# Patient Record
Sex: Female | Born: 1935 | Race: White | Hispanic: No | Marital: Married | Smoking: Former smoker
Health system: Southern US, Community
[De-identification: ages and names within clinical notes are randomized; demographics above are authoritative.]

## PROBLEM LIST (undated history)

## (undated) DIAGNOSIS — H269 Unspecified cataract: Secondary | ICD-10-CM

## (undated) DIAGNOSIS — M810 Age-related osteoporosis without current pathological fracture: Secondary | ICD-10-CM

## (undated) DIAGNOSIS — C801 Malignant (primary) neoplasm, unspecified: Secondary | ICD-10-CM

## (undated) DIAGNOSIS — IMO0002 Reserved for concepts with insufficient information to code with codable children: Secondary | ICD-10-CM

## (undated) DIAGNOSIS — K219 Gastro-esophageal reflux disease without esophagitis: Secondary | ICD-10-CM

## (undated) DIAGNOSIS — F5231 Female orgasmic disorder: Secondary | ICD-10-CM

## (undated) HISTORY — DX: Gastro-esophageal reflux disease without esophagitis: K21.9

## (undated) HISTORY — DX: Reserved for concepts with insufficient information to code with codable children: IMO0002

## (undated) HISTORY — DX: Malignant (primary) neoplasm, unspecified: C80.1

## (undated) HISTORY — DX: Female orgasmic disorder: F52.31

## (undated) HISTORY — DX: Age-related osteoporosis without current pathological fracture: M81.0

## (undated) HISTORY — PX: SYNOVIAL CYST EXCISION: SUR507

## (undated) HISTORY — PX: CATARACT EXTRACTION: SUR2

## (undated) HISTORY — DX: Unspecified cataract: H26.9

---

## 1996-11-19 DIAGNOSIS — C801 Malignant (primary) neoplasm, unspecified: Secondary | ICD-10-CM

## 1996-11-19 HISTORY — DX: Malignant (primary) neoplasm, unspecified: C80.1

## 1997-01-17 HISTORY — PX: BREAST SURGERY: SHX581

## 1998-11-19 DIAGNOSIS — K219 Gastro-esophageal reflux disease without esophagitis: Secondary | ICD-10-CM

## 1998-11-19 HISTORY — DX: Gastro-esophageal reflux disease without esophagitis: K21.9

## 1999-03-01 ENCOUNTER — Other Ambulatory Visit: Admission: RE | Admit: 1999-03-01 | Discharge: 1999-03-01 | Payer: Self-pay | Admitting: Gynecology

## 1999-12-22 ENCOUNTER — Other Ambulatory Visit: Admission: RE | Admit: 1999-12-22 | Discharge: 1999-12-22 | Payer: Self-pay | Admitting: Obstetrics and Gynecology

## 2000-12-20 ENCOUNTER — Other Ambulatory Visit: Admission: RE | Admit: 2000-12-20 | Discharge: 2000-12-20 | Payer: Self-pay | Admitting: Obstetrics and Gynecology

## 2001-06-03 ENCOUNTER — Ambulatory Visit (HOSPITAL_COMMUNITY): Admission: RE | Admit: 2001-06-03 | Discharge: 2001-06-03 | Payer: Self-pay | Admitting: Gastroenterology

## 2002-01-12 ENCOUNTER — Other Ambulatory Visit: Admission: RE | Admit: 2002-01-12 | Discharge: 2002-01-12 | Payer: Self-pay | Admitting: Obstetrics and Gynecology

## 2002-08-19 ENCOUNTER — Ambulatory Visit (HOSPITAL_COMMUNITY): Admission: RE | Admit: 2002-08-19 | Discharge: 2002-08-19 | Payer: Self-pay | Admitting: Oncology

## 2002-08-19 ENCOUNTER — Encounter: Payer: Self-pay | Admitting: Oncology

## 2002-11-24 ENCOUNTER — Ambulatory Visit (HOSPITAL_BASED_OUTPATIENT_CLINIC_OR_DEPARTMENT_OTHER): Admission: RE | Admit: 2002-11-24 | Discharge: 2002-11-24 | Payer: Self-pay | Admitting: Orthopedic Surgery

## 2003-01-18 ENCOUNTER — Other Ambulatory Visit: Admission: RE | Admit: 2003-01-18 | Discharge: 2003-01-18 | Payer: Self-pay | Admitting: Obstetrics and Gynecology

## 2003-06-14 ENCOUNTER — Encounter: Admission: RE | Admit: 2003-06-14 | Discharge: 2003-06-14 | Payer: Self-pay | Admitting: *Deleted

## 2003-06-14 ENCOUNTER — Encounter: Payer: Self-pay | Admitting: *Deleted

## 2004-01-25 ENCOUNTER — Other Ambulatory Visit: Admission: RE | Admit: 2004-01-25 | Discharge: 2004-01-25 | Payer: Self-pay | Admitting: Obstetrics and Gynecology

## 2004-04-13 ENCOUNTER — Encounter: Admission: RE | Admit: 2004-04-13 | Discharge: 2004-04-13 | Payer: Self-pay | Admitting: Orthopedic Surgery

## 2005-02-06 ENCOUNTER — Ambulatory Visit: Payer: Self-pay | Admitting: Oncology

## 2005-02-14 ENCOUNTER — Other Ambulatory Visit: Admission: RE | Admit: 2005-02-14 | Discharge: 2005-02-14 | Payer: Self-pay | Admitting: Obstetrics and Gynecology

## 2005-05-23 ENCOUNTER — Encounter: Admission: RE | Admit: 2005-05-23 | Discharge: 2005-05-23 | Payer: Self-pay | Admitting: Orthopedic Surgery

## 2005-09-07 ENCOUNTER — Ambulatory Visit: Payer: Self-pay | Admitting: Oncology

## 2006-03-01 ENCOUNTER — Ambulatory Visit: Payer: Self-pay | Admitting: Oncology

## 2006-03-04 LAB — COMPREHENSIVE METABOLIC PANEL
Albumin: 4.4 g/dL (ref 3.5–5.2)
Alkaline Phosphatase: 38 U/L — ABNORMAL LOW (ref 39–117)
CO2: 25 mEq/L (ref 19–32)
Chloride: 104 mEq/L (ref 96–112)
Creatinine, Ser: 0.8 mg/dL (ref 0.4–1.2)
Potassium: 4.8 mEq/L (ref 3.5–5.3)
Sodium: 139 mEq/L (ref 135–145)

## 2006-03-04 LAB — CBC WITH DIFFERENTIAL/PLATELET
EOS%: 1 % (ref 0.0–7.0)
Eosinophils Absolute: 0 10*3/uL (ref 0.0–0.5)
HCT: 42.9 % (ref 34.8–46.6)
LYMPH%: 33.3 % (ref 14.0–48.0)
MCHC: 33.8 g/dL (ref 32.0–36.0)
MCV: 93.1 fL (ref 81.0–101.0)
MONO#: 0.3 10*3/uL (ref 0.1–0.9)
MONO%: 6 % (ref 0.0–13.0)
RBC: 4.61 10*6/uL (ref 3.70–5.32)
lymph#: 1.6 10*3/uL (ref 0.9–3.3)

## 2006-03-07 ENCOUNTER — Encounter: Admission: RE | Admit: 2006-03-07 | Discharge: 2006-03-07 | Payer: Self-pay | Admitting: Orthopedic Surgery

## 2006-04-19 ENCOUNTER — Other Ambulatory Visit: Admission: RE | Admit: 2006-04-19 | Discharge: 2006-04-19 | Payer: Self-pay | Admitting: Obstetrics and Gynecology

## 2006-08-28 ENCOUNTER — Ambulatory Visit: Payer: Self-pay | Admitting: Oncology

## 2006-08-30 LAB — CBC WITH DIFFERENTIAL/PLATELET
EOS%: 1.2 % (ref 0.0–7.0)
Eosinophils Absolute: 0.1 10*3/uL (ref 0.0–0.5)
HGB: 14.7 g/dL (ref 11.6–15.9)
LYMPH%: 34 % (ref 14.0–48.0)
MCV: 93.2 fL (ref 81.0–101.0)
MONO#: 0.3 10*3/uL (ref 0.1–0.9)
MONO%: 7.7 % (ref 0.0–13.0)
Platelets: 261 10*3/uL (ref 145–400)
RBC: 4.61 10*6/uL (ref 3.70–5.32)
RDW: 13 % (ref 11.3–14.5)
WBC: 4.2 10*3/uL (ref 3.9–10.0)
lymph#: 1.4 10*3/uL (ref 0.9–3.3)

## 2006-08-30 LAB — COMPREHENSIVE METABOLIC PANEL
ALT: 19 U/L (ref 0–40)
AST: 17 U/L (ref 0–37)
Creatinine, Ser: 0.86 mg/dL (ref 0.40–1.20)
Sodium: 139 mEq/L (ref 135–145)
Total Bilirubin: 0.8 mg/dL (ref 0.3–1.2)

## 2006-12-31 ENCOUNTER — Ambulatory Visit: Payer: Self-pay | Admitting: Oncology

## 2007-02-03 ENCOUNTER — Encounter: Admission: RE | Admit: 2007-02-03 | Discharge: 2007-02-03 | Payer: Self-pay | Admitting: Orthopedic Surgery

## 2007-04-07 ENCOUNTER — Encounter: Admission: RE | Admit: 2007-04-07 | Discharge: 2007-04-07 | Payer: Self-pay | Admitting: Orthopedic Surgery

## 2007-04-22 ENCOUNTER — Other Ambulatory Visit: Admission: RE | Admit: 2007-04-22 | Discharge: 2007-04-22 | Payer: Self-pay | Admitting: Obstetrics & Gynecology

## 2007-07-02 ENCOUNTER — Ambulatory Visit: Payer: Self-pay | Admitting: Oncology

## 2007-07-04 LAB — COMPREHENSIVE METABOLIC PANEL
BUN: 16 mg/dL (ref 6–23)
CO2: 24 mEq/L (ref 19–32)
Chloride: 108 mEq/L (ref 96–112)
Glucose, Bld: 61 mg/dL — ABNORMAL LOW (ref 70–99)
Potassium: 4.6 mEq/L (ref 3.5–5.3)
Total Protein: 6.3 g/dL (ref 6.0–8.3)

## 2007-07-04 LAB — CBC WITH DIFFERENTIAL/PLATELET
Basophils Absolute: 0 10*3/uL (ref 0.0–0.1)
Eosinophils Absolute: 0.1 10*3/uL (ref 0.0–0.5)
MCHC: 35.6 g/dL (ref 32.0–36.0)
MCV: 92.2 fL (ref 81.0–101.0)
MONO#: 0.3 10*3/uL (ref 0.1–0.9)
NEUT%: 45.6 % (ref 39.6–76.8)
Platelets: 291 10*3/uL (ref 145–400)
lymph#: 1.6 10*3/uL (ref 0.9–3.3)

## 2008-01-06 ENCOUNTER — Encounter: Admission: RE | Admit: 2008-01-06 | Discharge: 2008-01-06 | Payer: Self-pay | Admitting: Orthopedic Surgery

## 2008-04-21 ENCOUNTER — Other Ambulatory Visit: Admission: RE | Admit: 2008-04-21 | Discharge: 2008-04-21 | Payer: Self-pay | Admitting: Obstetrics and Gynecology

## 2008-06-09 ENCOUNTER — Ambulatory Visit: Payer: Self-pay | Admitting: Oncology

## 2008-11-19 HISTORY — PX: BUNIONECTOMY: SHX129

## 2008-12-20 ENCOUNTER — Encounter: Admission: RE | Admit: 2008-12-20 | Discharge: 2008-12-20 | Payer: Self-pay | Admitting: Orthopedic Surgery

## 2009-07-12 ENCOUNTER — Encounter: Payer: Self-pay | Admitting: *Deleted

## 2009-07-12 DIAGNOSIS — M899 Disorder of bone, unspecified: Secondary | ICD-10-CM | POA: Insufficient documentation

## 2009-07-12 DIAGNOSIS — M545 Low back pain: Secondary | ICD-10-CM

## 2009-07-12 DIAGNOSIS — C50919 Malignant neoplasm of unspecified site of unspecified female breast: Secondary | ICD-10-CM | POA: Insufficient documentation

## 2009-07-12 DIAGNOSIS — M1990 Unspecified osteoarthritis, unspecified site: Secondary | ICD-10-CM | POA: Insufficient documentation

## 2009-07-12 DIAGNOSIS — G5710 Meralgia paresthetica, unspecified lower limb: Secondary | ICD-10-CM

## 2009-07-12 DIAGNOSIS — R32 Unspecified urinary incontinence: Secondary | ICD-10-CM

## 2009-07-12 DIAGNOSIS — M949 Disorder of cartilage, unspecified: Secondary | ICD-10-CM

## 2009-07-12 DIAGNOSIS — Z9189 Other specified personal risk factors, not elsewhere classified: Secondary | ICD-10-CM

## 2009-07-12 DIAGNOSIS — K219 Gastro-esophageal reflux disease without esophagitis: Secondary | ICD-10-CM | POA: Insufficient documentation

## 2009-07-12 DIAGNOSIS — E7800 Pure hypercholesterolemia, unspecified: Secondary | ICD-10-CM

## 2009-07-12 DIAGNOSIS — H3530 Unspecified macular degeneration: Secondary | ICD-10-CM | POA: Insufficient documentation

## 2009-07-13 ENCOUNTER — Ambulatory Visit: Payer: Self-pay | Admitting: Pulmonary Disease

## 2009-07-13 DIAGNOSIS — R05 Cough: Secondary | ICD-10-CM

## 2009-08-01 ENCOUNTER — Ambulatory Visit: Payer: Self-pay | Admitting: Pulmonary Disease

## 2009-08-19 ENCOUNTER — Encounter: Admission: RE | Admit: 2009-08-19 | Discharge: 2009-08-19 | Payer: Self-pay | Admitting: Neurological Surgery

## 2009-08-19 HISTORY — PX: MOHS SURGERY: SUR867

## 2010-12-10 ENCOUNTER — Encounter: Payer: Self-pay | Admitting: Orthopedic Surgery

## 2010-12-13 ENCOUNTER — Ambulatory Visit
Admission: RE | Admit: 2010-12-13 | Discharge: 2010-12-13 | Payer: Self-pay | Source: Home / Self Care | Attending: Pulmonary Disease | Admitting: Pulmonary Disease

## 2010-12-13 DIAGNOSIS — J310 Chronic rhinitis: Secondary | ICD-10-CM | POA: Insufficient documentation

## 2010-12-21 NOTE — Assessment & Plan Note (Signed)
Summary: rov for chronic rhinitis   Copy to:  Theressa Millard Primary Provider/Referring Provider:  Theressa Millard  CC:  Pt is here for a f/u appt.  Last seen Sept 2010.  Pt c/o "glob of mucus in back of throat" first thing in the am and believes this is d/t PND.  Pt denies sob. Marland Kitchen  History of Present Illness: the pt comes in today for distant f/u of chronic cough.  She was last seen in 2010, and felt to have cough due to postnasal drip/rhinitis as well as LPR.  She was treated with PPI, as well as meds for PND with good success.  She comes in today where she has been noting postnasal drip thru the night, and awakens with a globus sensation and cough first thing in am.  She is taking antihistamine at bedtime, and when she takes atrovent nasal spray in the am it helps quite a bit.    Current Medications (verified): 1)  Oxytrol 3.9 Mg/24hr Pttw (Oxybutynin) .Marland Kitchen.. 1 Patch Twice A Week 2)  Ipratropium Bromide 0.06 % Soln (Ipratropium Bromide) .... Use in Nostril Two Times A Day 3)  Aspirin 81 Mg  Tabs (Aspirin) .... Take 1 Tablet By Mouth Once A Day 4)  Vitamin D 1000 Unit Tabs (Cholecalciferol) 5)  Ginger 500 Mg Caps (Ginger (Zingiber Officinalis)) .... As Needed 6)  Multivitamins  Tabs (Multiple Vitamin) .... Take 1 Tablet By Mouth Once A Day 7)  Calcium 600-D 600-400 Mg-Unit Tabs (Calcium Carbonate-Vitamin D) .... Take 1 Tablet By Mouth Two Times A Day 8)  Omeprazole 40 Mg Cpdr (Omeprazole) .... One in Am and Pm 9)  Magnesium .... Take By Mouth As Needed 10)  Fish Oil .... Take By Mouth Daily 11)  Glucosamine-Chondroitin 1500-1200 Mg/4ml Liqd (Glucosamine-Chondroitin) 12)  Chlorpheniramine Maleate 4 Mg Tabs (Chlorpheniramine Maleate) .... Take 2 Tabs By Mouth Daily 13)  Netipot  Allergies (verified): No Known Drug Allergies  Review of Systems       The patient complains of productive cough, nasal congestion/difficulty breathing through nose, and joint stiffness or pain.  The patient denies  shortness of breath with activity, shortness of breath at rest, non-productive cough, coughing up blood, chest pain, irregular heartbeats, acid heartburn, indigestion, loss of appetite, weight change, abdominal pain, difficulty swallowing, sore throat, tooth/dental problems, headaches, sneezing, itching, ear ache, anxiety, depression, hand/feet swelling, rash, change in color of mucus, and fever.    Vital Signs:  Patient profile:   75 year old female Height:      63 inches Weight:      153 pounds BMI:     27.20 O2 Sat:      98 % on Room air Temp:     98.2 degrees F oral Pulse rate:   76 / minute BP sitting:   126 / 84  (left arm) Cuff size:   regular  Vitals Entered By: Arman Filter LPN (December 13, 2010 11:54 AM)  O2 Flow:  Room air CC: Pt is here for a f/u appt.  Last seen Sept 2010.  Pt c/o "glob of mucus in back of throat" first thing in the am and believes this is d/t PND.  Pt denies sob.  Comments Medications reviewed with patient Arman Filter LPN  December 13, 2010 11:54 AM    Physical Exam  General:  ow female in nad  Nose:  no purulence or discharge noted.  no crusting Mouth:  clear  Lungs:  totally clear to auscultation Heart:  rrr Extremities:  no edema or cyanosis  Neurologic:  alert and oriented, moves all 4.   Impression & Recommendations:  Problem # 1:  COUGH (ICD-786.2)  Much improved from in the past, but continues to have some symptoms related to postnasal drip.  She thinks her reflux is controlled currently unless she has dietary indescretion.    Problem # 2:  CHRONIC RHINITIS (ICD-472.0)  the pt continues to have issues with postnasal drip at times.  I suspect she has both allergic and vasomotor rhinitis.  She feels the atrovent nasal spray is very good at stopping her drip.  I have asked her to try taking in evening at bedtime rather than first thing in the am to see if it helps postnasal drip thru the night.  If she continues to have issues, may  benefit from ENT evaluation.  Could consider allergy eval as well, but unsure how much of this is really an allergic mechanism.    Medications Added to Medication List This Visit: 1)  Vitamin D 1000 Unit Tabs (Cholecalciferol) 2)  Glucosamine-chondroitin 1500-1200 Mg/38ml Liqd (Glucosamine-chondroitin) 3)  Chlorpheniramine Maleate 4 Mg Tabs (Chlorpheniramine maleate) .... Take 2 tabs by mouth daily 4)  Netipot   Other Orders: Est. Patient Level III (82956)  Patient Instructions: 1)  try taking atrovent nasal spray at bedtime rather than first thing in am.   2)  if you continue to have issues, it may be helpful to see ENT one time for evaluation of upper airway/nasal anatomy   Immunization History:  Influenza Immunization History:    Influenza:  historical (08/19/2010)

## 2011-04-06 NOTE — Op Note (Signed)
NAMEANISHKA, BUSHARD                              ACCOUNT NO.:  [id number]   MEDICAL RECORD NO.:  [id number]                   PATIENT TYPE:  AMB   LOCATION:  [facility]                                  FACILITY:  [facility]   PHYSICIAN:  Nadara Mustard, M.D.                DATE OF BIRTH:  1936-09-28   DATE OF PROCEDURE:  11/24/2002  DATE OF DISCHARGE:                                 OPERATIVE REPORT   PREOPERATIVE DIAGNOSIS:  Hallux valgus deformity left great toe.   POSTOPERATIVE DIAGNOSIS:  Hallux valgus deformity left great toe.   PROCEDURE:  Left great toe Chevron osteotomy.   SURGEON:  Nadara Mustard, M.D.   ANESTHESIA:  LMA.   ESTIMATED BLOOD LOSS:  Minimal.   ANTIBIOTICS:  1 gram Kefzol.   TOURNIQUET TIME:  Esmarch to the ankle for approximately 20 minutes.   DISPOSITION:  To PACU in stable condition.   INDICATIONS FOR PROCEDURE:  The patient is a 75 year old woman with a  moderate hallux valgus deformity of her left great toe. The patient has had  pain with shoe wear, has failed conservative care, and her metatarsal angle  is 18 degrees, hallux valgus angle 25 degrees, and she presents at this time  for Chevron osteotomy.  The risks and benefits were discussed including  infection, neurovascular injury, persistent pain, need for additional  surgery.  The patient states she understands and wishes to proceed at this  time.   DESCRIPTION OF PROCEDURE:  The patient was brought to OR room #1 and  underwent a general LMA anesthetic. After adequate level of anesthesia was  obtained, the patient's left lower extremity was prepped using Duraprep and  draped in a sterile field.  Collier Flowers was used to cover all exposed skin. A  longitudinal incision was made over the medial border of the left great toe.  This was carried down through the capsular fascia. The plantar aspect of the  capsule was ellipsed out in a football fashion shape to allow for closure of  the capsule.  A ostectomy was  performed off the lateral bump as well as an  ostectomy was performed off the dorsal bony spur from the great toe. A  Chevron cut was then made and the distal head was displaced laterally  approximately 4 mm.  This was stabilized with a 6.2 K-wire from dorsal to  plantar. The remainder of the bony prominence medially was removed with the  oscillating saw. The wound was irrigated with normal saline.  The toe was  held reduced and the capsule was closed using a 2-0 Vicryl and the skin was  closed using interrupted 2-0 nylon with a vertical mattress suture.  The  wound  was covered with Adaptic, orthopedic sponges, sterile Webril, and a loosely  wrapped Coban.  The K-wire was trimmed just above the skin. The patient was  extubated and taken  to the PACU in stable condition.  Planned for touchdown  weightbearing.  Planned to follow up in the office in one week.                                               Nadara Mustard, M.D.    MVD/MEDQ  D:  11/24/2002  T:  11/24/2002  Job:  [id number]

## 2011-12-04 DIAGNOSIS — M47817 Spondylosis without myelopathy or radiculopathy, lumbosacral region: Secondary | ICD-10-CM | POA: Diagnosis not present

## 2011-12-17 DIAGNOSIS — H16109 Unspecified superficial keratitis, unspecified eye: Secondary | ICD-10-CM | POA: Diagnosis not present

## 2012-03-19 DIAGNOSIS — M5137 Other intervertebral disc degeneration, lumbosacral region: Secondary | ICD-10-CM | POA: Diagnosis not present

## 2012-03-21 DIAGNOSIS — M545 Low back pain: Secondary | ICD-10-CM | POA: Diagnosis not present

## 2012-04-02 DIAGNOSIS — M48061 Spinal stenosis, lumbar region without neurogenic claudication: Secondary | ICD-10-CM | POA: Diagnosis not present

## 2012-04-08 DIAGNOSIS — M4120 Other idiopathic scoliosis, site unspecified: Secondary | ICD-10-CM | POA: Diagnosis not present

## 2012-04-08 DIAGNOSIS — M48061 Spinal stenosis, lumbar region without neurogenic claudication: Secondary | ICD-10-CM | POA: Diagnosis not present

## 2012-04-08 DIAGNOSIS — M47817 Spondylosis without myelopathy or radiculopathy, lumbosacral region: Secondary | ICD-10-CM | POA: Diagnosis not present

## 2012-05-12 DIAGNOSIS — Z01419 Encounter for gynecological examination (general) (routine) without abnormal findings: Secondary | ICD-10-CM | POA: Diagnosis not present

## 2012-05-12 DIAGNOSIS — Z124 Encounter for screening for malignant neoplasm of cervix: Secondary | ICD-10-CM | POA: Diagnosis not present

## 2012-05-12 DIAGNOSIS — Z9189 Other specified personal risk factors, not elsewhere classified: Secondary | ICD-10-CM | POA: Diagnosis not present

## 2012-05-13 DIAGNOSIS — R05 Cough: Secondary | ICD-10-CM | POA: Diagnosis not present

## 2012-05-13 DIAGNOSIS — R059 Cough, unspecified: Secondary | ICD-10-CM | POA: Diagnosis not present

## 2012-05-14 DIAGNOSIS — N8111 Cystocele, midline: Secondary | ICD-10-CM | POA: Diagnosis not present

## 2012-05-27 DIAGNOSIS — N8111 Cystocele, midline: Secondary | ICD-10-CM | POA: Diagnosis not present

## 2012-06-03 DIAGNOSIS — N8111 Cystocele, midline: Secondary | ICD-10-CM | POA: Diagnosis not present

## 2012-09-01 DIAGNOSIS — Z85828 Personal history of other malignant neoplasm of skin: Secondary | ICD-10-CM | POA: Diagnosis not present

## 2012-09-01 DIAGNOSIS — L819 Disorder of pigmentation, unspecified: Secondary | ICD-10-CM | POA: Diagnosis not present

## 2012-09-01 DIAGNOSIS — C44519 Basal cell carcinoma of skin of other part of trunk: Secondary | ICD-10-CM | POA: Diagnosis not present

## 2012-09-01 DIAGNOSIS — D692 Other nonthrombocytopenic purpura: Secondary | ICD-10-CM | POA: Diagnosis not present

## 2012-09-01 DIAGNOSIS — D239 Other benign neoplasm of skin, unspecified: Secondary | ICD-10-CM | POA: Diagnosis not present

## 2012-09-01 DIAGNOSIS — L570 Actinic keratosis: Secondary | ICD-10-CM | POA: Diagnosis not present

## 2012-09-01 DIAGNOSIS — L821 Other seborrheic keratosis: Secondary | ICD-10-CM | POA: Diagnosis not present

## 2012-09-01 DIAGNOSIS — L259 Unspecified contact dermatitis, unspecified cause: Secondary | ICD-10-CM | POA: Diagnosis not present

## 2012-09-10 DIAGNOSIS — Z1231 Encounter for screening mammogram for malignant neoplasm of breast: Secondary | ICD-10-CM | POA: Diagnosis not present

## 2012-09-11 DIAGNOSIS — N390 Urinary tract infection, site not specified: Secondary | ICD-10-CM | POA: Diagnosis not present

## 2012-09-11 DIAGNOSIS — Z131 Encounter for screening for diabetes mellitus: Secondary | ICD-10-CM | POA: Diagnosis not present

## 2012-09-11 DIAGNOSIS — Z0000 Encounter for general adult medical examination without abnormal findings: Secondary | ICD-10-CM | POA: Diagnosis not present

## 2012-09-11 DIAGNOSIS — Z23 Encounter for immunization: Secondary | ICD-10-CM | POA: Diagnosis not present

## 2012-09-11 DIAGNOSIS — Z1331 Encounter for screening for depression: Secondary | ICD-10-CM | POA: Diagnosis not present

## 2012-09-15 DIAGNOSIS — C44519 Basal cell carcinoma of skin of other part of trunk: Secondary | ICD-10-CM | POA: Diagnosis not present

## 2012-09-15 DIAGNOSIS — Z85828 Personal history of other malignant neoplasm of skin: Secondary | ICD-10-CM | POA: Diagnosis not present

## 2012-09-30 DIAGNOSIS — H18419 Arcus senilis, unspecified eye: Secondary | ICD-10-CM | POA: Diagnosis not present

## 2012-09-30 DIAGNOSIS — H5210 Myopia, unspecified eye: Secondary | ICD-10-CM | POA: Diagnosis not present

## 2012-09-30 DIAGNOSIS — H43399 Other vitreous opacities, unspecified eye: Secondary | ICD-10-CM | POA: Diagnosis not present

## 2012-09-30 DIAGNOSIS — Z961 Presence of intraocular lens: Secondary | ICD-10-CM | POA: Diagnosis not present

## 2012-12-12 DIAGNOSIS — M47817 Spondylosis without myelopathy or radiculopathy, lumbosacral region: Secondary | ICD-10-CM | POA: Diagnosis not present

## 2012-12-12 DIAGNOSIS — M4180 Other forms of scoliosis, site unspecified: Secondary | ICD-10-CM | POA: Diagnosis not present

## 2012-12-12 DIAGNOSIS — M4120 Other idiopathic scoliosis, site unspecified: Secondary | ICD-10-CM | POA: Diagnosis not present

## 2012-12-12 DIAGNOSIS — IMO0002 Reserved for concepts with insufficient information to code with codable children: Secondary | ICD-10-CM | POA: Diagnosis not present

## 2013-03-23 ENCOUNTER — Telehealth: Payer: Self-pay | Admitting: *Deleted

## 2013-03-23 NOTE — Telephone Encounter (Signed)
Patient chart has a note that states unsure if the patient was to continue taking this medication in 05/12/12 please advise.

## 2013-03-24 ENCOUNTER — Other Ambulatory Visit: Payer: Self-pay | Admitting: *Deleted

## 2013-03-24 MED ORDER — RALOXIFENE HCL 60 MG PO TABS: 60.0000 mg | ORAL_TABLET | Freq: Every day | ORAL | Status: DC

## 2013-03-24 NOTE — Telephone Encounter (Signed)
Patient will need phone call regarding continuation decision

## 2013-03-24 NOTE — Telephone Encounter (Signed)
LMTCB  aa    (Need to ask if she is still taking Evista and if she would like to continue it.)

## 2013-03-24 NOTE — Telephone Encounter (Signed)
Spoke with pt about Evista. Pt is still taking it and is doing well with no side effects she is aware of. Pt would like a refill, and she still uses [facility].

## 2013-03-24 NOTE — Telephone Encounter (Signed)
Sent with 5 Rf

## 2013-03-24 NOTE — Telephone Encounter (Signed)
Ok to refill until exam

## 2013-03-26 ENCOUNTER — Other Ambulatory Visit: Payer: Self-pay | Admitting: Orthopedic Surgery

## 2013-06-30 ENCOUNTER — Ambulatory Visit: Payer: Self-pay | Admitting: Obstetrics and Gynecology

## 2013-07-15 DIAGNOSIS — M899 Disorder of bone, unspecified: Secondary | ICD-10-CM | POA: Diagnosis not present

## 2013-07-17 ENCOUNTER — Encounter: Payer: Self-pay | Admitting: Obstetrics and Gynecology

## 2013-07-17 ENCOUNTER — Ambulatory Visit (INDEPENDENT_AMBULATORY_CARE_PROVIDER_SITE_OTHER): Payer: Medicare Other | Admitting: Obstetrics and Gynecology

## 2013-07-17 VITALS — BP 132/85 | HR 73 | Resp 14 | Ht 62.5 in | Wt 136.0 lb

## 2013-07-17 DIAGNOSIS — Z124 Encounter for screening for malignant neoplasm of cervix: Secondary | ICD-10-CM | POA: Diagnosis not present

## 2013-07-17 DIAGNOSIS — Z01419 Encounter for gynecological examination (general) (routine) without abnormal findings: Secondary | ICD-10-CM

## 2013-07-17 NOTE — Progress Notes (Signed)
77 y.o.   Married    Caucasian   female   G2P2   here for annual exam.  Moving to [place] soon.    Patient's last menstrual period was 11/20/1987.          Sexually active: no  The current method of family planning is abstinence and post menopausal status.    Exercising: Pilates, Yoga, Weights Last mammogram:  Oct 2013 Last pap smear: History of abnormal pap:  Smoking: quit smoking in 1966 Alcohol: 5-6 glasses of wine week Last colonoscopy: 2012 ? Polyps, repeat in 5 years Last Bone Density:  07/16/11 Osteopenia, 07/15/13 Last tetanus shot: less than 10 years Last cholesterol check: 09/2013 normal  Hgb:   pcp             Urine: pcp   Family History  Problem Relation Age of Onset  . Osteoporosis Mother   . Heart disease Father     Patient Active Problem List   Diagnosis Date Noted  . CHRONIC RHINITIS 12/13/2010  . COUGH 07/13/2009  . BREAST CANCER 07/12/2009  . PURE HYPERCHOLESTEROLEMIA 07/12/2009  . MERALGIA PARESTHETICA 07/12/2009  . MACULAR DEGENERATION 07/12/2009  . GERD 07/12/2009  . OSTEOARTHRITIS 07/12/2009  . BACK PAIN, LUMBAR 07/12/2009  . OSTEOPENIA 07/12/2009  . URINARY INCONTINENCE 07/12/2009  . BREAST BIOPSY, BY INCISION, HX OF 07/12/2009    Past Medical History  Diagnosis Date  . Osteoporosis   . GERD (gastroesophageal reflux disease) 2000  . Cancer 1998    Left breast Lumpectomy, Chemo,Rad  . Anorgasmia of female   . DDD (degenerative disc disease)   . Cataract 2010 2011    Past Surgical History  Procedure Laterality Date  . Breast surgery  3/98    lumpectomy (left)  . Cataract extraction Bilateral 2010 2011  . Synovial cyst excision      lumbar  . Mohs surgery  08/2009    nose, squamous cell ca    Allergies: Review of patient's allergies indicates no known allergies.  Current Outpatient Prescriptions  Medication Sig Dispense Refill  . aspirin 81 MG tablet Take 81 mg by mouth daily.      . chlorpheniramine (CHLOR-TRIMETON) 4 MG  tablet Take 4 mg by mouth 2 (two) times daily as needed for allergies.      . Ginger, Zingiber officinalis, (GINGER PO) Take by mouth.      . Oxymetazoline HCl (NASAL SPRAY NA) Place into the nose daily.      . raloxifene (EVISTA) 60 MG tablet Take 1 tablet (60 mg total) by mouth daily.  30 tablet  5  . OMEPRAZOLE PO Take by mouth.       No current facility-administered medications for this visit.    ROS: Pertinent items are noted in HPI.  Social AV:[id number], two children, French Ana, who has 2 daughters, and Lanora Manis who has 2 sons  Exam:    BP 132/85  Pulse 73  Resp 14  Ht 5' 2.5" (1.588 m)  Wt 136 lb (61.689 kg)  BMI 24.46 kg/m2  LMP 01/01/1989Ht stable and wt down 9 pounds from last yr   Wt Readings from Last 3 Encounters:  07/17/13 136 lb (61.689 kg)  12/13/10 153 lb (69.4 kg)  08/01/09 157 lb 2.1 oz (71.274 kg)     Ht Readings from Last 3 Encounters:  07/17/13 5' 2.5" (1.588 m)  12/13/10 5\' 3"  (1.6 m)  08/01/09 5\' 3"  (1.6 m)    General appearance: alert, cooperative and appears stated age  Head: Normocephalic, without obvious abnormality, atraumatic Neck: no adenopathy, supple, symmetrical, trachea midline and thyroid not enlarged, symmetric, no tenderness/mass/nodules Lungs: clear to auscultation bilaterally Breasts: Inspection negative, No nipple retraction or dimpling, No nipple discharge or bleeding, No axillary or supraclavicular adenopathy, Normal to palpation without dominant masses Heart: regular rate and rhythm Abdomen: soft, non-tender; bowel sounds normal; no masses,  no organomegaly Extremities: extremities normal, atraumatic, no cyanosis or edema Skin: Skin color, texture, turgor normal. No rashes or lesions Lymph nodes: Cervical, supraclavicular, and axillary nodes normal. No abnormal inguinal nodes palpated Neurologic: Grossly normal   Pelvic: External genitalia:  no lesions              Urethra:  normal appearing urethra with no masses, tenderness or  lesions              Bartholins and Skenes: normal                 Vagina: normal appearing vagina with normal color and discharge, no lesions.  No vaginal excoriations from pessary.                Cervix: normal appearance              Pap taken: no        Bimanual Exam:  Uterus:  uterus is normal size, shape, consistency and nontender                                      Adnexa: normal adnexa in size, nontender and no masses                                      Rectovaginal: Confirms                                      Anus:  normal sphincter tone, no lesions  A: normal menopausal exam, no HRT     Gr 3 cycstocele, using #4 ring with support     Left breast cancer 1998 - lumpectomy, ax node dissection negative, tamox, RT     Sq cell ca nose s/p Mohs 2010     P:     mammogram counseled on breast self exam, mammography screening, adequate intake of calcium and vitamin D, diet and exercise return annually or prn     An After Visit Summary was printed and given to the patient.

## 2013-07-17 NOTE — Patient Instructions (Signed)

## 2013-08-14 DIAGNOSIS — IMO0002 Reserved for concepts with insufficient information to code with codable children: Secondary | ICD-10-CM | POA: Diagnosis not present

## 2013-08-14 DIAGNOSIS — M47817 Spondylosis without myelopathy or radiculopathy, lumbosacral region: Secondary | ICD-10-CM | POA: Diagnosis not present

## 2013-09-07 DIAGNOSIS — D239 Other benign neoplasm of skin, unspecified: Secondary | ICD-10-CM | POA: Diagnosis not present

## 2013-09-07 DIAGNOSIS — L570 Actinic keratosis: Secondary | ICD-10-CM | POA: Diagnosis not present

## 2013-09-07 DIAGNOSIS — L819 Disorder of pigmentation, unspecified: Secondary | ICD-10-CM | POA: Diagnosis not present

## 2013-09-07 DIAGNOSIS — L719 Rosacea, unspecified: Secondary | ICD-10-CM | POA: Diagnosis not present

## 2013-09-07 DIAGNOSIS — Z85828 Personal history of other malignant neoplasm of skin: Secondary | ICD-10-CM | POA: Diagnosis not present

## 2013-09-07 DIAGNOSIS — L821 Other seborrheic keratosis: Secondary | ICD-10-CM | POA: Diagnosis not present

## 2013-09-07 DIAGNOSIS — Q828 Other specified congenital malformations of skin: Secondary | ICD-10-CM | POA: Diagnosis not present

## 2013-09-07 DIAGNOSIS — D1801 Hemangioma of skin and subcutaneous tissue: Secondary | ICD-10-CM | POA: Diagnosis not present

## 2013-09-07 DIAGNOSIS — D692 Other nonthrombocytopenic purpura: Secondary | ICD-10-CM | POA: Diagnosis not present

## 2013-09-14 DIAGNOSIS — Z1231 Encounter for screening mammogram for malignant neoplasm of breast: Secondary | ICD-10-CM | POA: Diagnosis not present

## 2013-09-17 DIAGNOSIS — E7800 Pure hypercholesterolemia, unspecified: Secondary | ICD-10-CM | POA: Diagnosis not present

## 2013-09-17 DIAGNOSIS — Z853 Personal history of malignant neoplasm of breast: Secondary | ICD-10-CM | POA: Diagnosis not present

## 2013-09-17 DIAGNOSIS — K219 Gastro-esophageal reflux disease without esophagitis: Secondary | ICD-10-CM | POA: Diagnosis not present

## 2013-09-17 DIAGNOSIS — Z0000 Encounter for general adult medical examination without abnormal findings: Secondary | ICD-10-CM | POA: Diagnosis not present

## 2013-09-17 DIAGNOSIS — Z131 Encounter for screening for diabetes mellitus: Secondary | ICD-10-CM | POA: Diagnosis not present

## 2013-09-17 DIAGNOSIS — Z1331 Encounter for screening for depression: Secondary | ICD-10-CM | POA: Diagnosis not present

## 2013-09-29 DIAGNOSIS — H52229 Regular astigmatism, unspecified eye: Secondary | ICD-10-CM | POA: Diagnosis not present

## 2013-09-29 DIAGNOSIS — H5210 Myopia, unspecified eye: Secondary | ICD-10-CM | POA: Diagnosis not present

## 2013-09-29 DIAGNOSIS — Z961 Presence of intraocular lens: Secondary | ICD-10-CM | POA: Diagnosis not present

## 2013-12-14 DIAGNOSIS — M545 Low back pain, unspecified: Secondary | ICD-10-CM | POA: Diagnosis not present

## 2013-12-14 DIAGNOSIS — M5430 Sciatica, unspecified side: Secondary | ICD-10-CM | POA: Diagnosis not present

## 2013-12-17 DIAGNOSIS — M545 Low back pain, unspecified: Secondary | ICD-10-CM | POA: Diagnosis not present

## 2013-12-21 ENCOUNTER — Other Ambulatory Visit: Payer: Self-pay | Admitting: *Deleted

## 2013-12-21 MED ORDER — RALOXIFENE HCL 60 MG PO TABS: 60.0000 mg | ORAL_TABLET | Freq: Every day | ORAL | Status: DC

## 2013-12-21 NOTE — Telephone Encounter (Signed)
Faxed refill request received from [place] for EVISTA Last filled by MD on 03/25/13, #30 X 5 Last AEX - 07/17/13 Last DEXA - 07/15/13, continue Evista Next AEX - not scheduled  RX sent until 06/2014

## 2014-01-15 DIAGNOSIS — M47817 Spondylosis without myelopathy or radiculopathy, lumbosacral region: Secondary | ICD-10-CM | POA: Diagnosis not present

## 2014-01-15 DIAGNOSIS — M4310 Spondylolisthesis, site unspecified: Secondary | ICD-10-CM | POA: Diagnosis not present

## 2014-01-15 DIAGNOSIS — IMO0002 Reserved for concepts with insufficient information to code with codable children: Secondary | ICD-10-CM | POA: Diagnosis not present

## 2014-02-09 ENCOUNTER — Telehealth: Payer: Self-pay

## 2014-02-09 NOTE — Telephone Encounter (Signed)
Pt was notified & she said she thinks her pharmacy had told her also.

## 2014-02-09 NOTE — Telephone Encounter (Signed)
Called pt to let her know that her insurance has approved her evista medication. lmtcb

## 2014-03-16 ENCOUNTER — Telehealth: Payer: Self-pay | Admitting: Gynecology

## 2014-03-16 MED ORDER — RALOXIFENE HCL 60 MG PO TABS: 60.0000 mg | ORAL_TABLET | Freq: Every day | ORAL | Status: DC

## 2014-03-16 NOTE — Telephone Encounter (Signed)
Spoke with patient. She states she lost her bottle of pills or threw it out, she is unsure. [place], attempted to perform a lost rx override and insurance would not cover.  She can pick up next refill at 03/31/14.  Patient will pay cash price for pick up. Requested 20 pills to [facility]. Escribed to [place] at this time. Patient grateful.   Routing to Dr. Charlies Constable.

## 2014-03-16 NOTE — Telephone Encounter (Signed)
Raloxifene generic for Evista. Patient lost her RX for this month and needs and needs to know what to do? She needs 20 pills to get her to the next refill date. She is aware there may be a lost RX charge. Please advise? I we need to charge the patient, please route back to me and I will call her.  [employer]

## 2014-03-19 DIAGNOSIS — R030 Elevated blood-pressure reading, without diagnosis of hypertension: Secondary | ICD-10-CM | POA: Diagnosis not present

## 2014-03-19 DIAGNOSIS — N390 Urinary tract infection, site not specified: Secondary | ICD-10-CM | POA: Diagnosis not present

## 2014-03-19 NOTE — Telephone Encounter (Signed)
Call to patient, she states she just left [facility] and has been given prescription.  Does not need anything from [place]. Call prn.  Routing to provider for final review. Patient agreeable to disposition. Will close encounter

## 2014-03-19 NOTE — Telephone Encounter (Signed)
Patient wants to know if we would send her a rx for Cipro for cystitis. She didn't take any with her out of town. It doesn't look like we have ever given her this before. She thinks it may have been from her pcp i told her she would more than likely need to call that provider to get a rx sent there. Patient said she is going to call them but still wanted me to send a message.

## 2014-04-20 DIAGNOSIS — L819 Disorder of pigmentation, unspecified: Secondary | ICD-10-CM | POA: Diagnosis not present

## 2014-04-20 DIAGNOSIS — Z85828 Personal history of other malignant neoplasm of skin: Secondary | ICD-10-CM | POA: Diagnosis not present

## 2014-04-20 DIAGNOSIS — L719 Rosacea, unspecified: Secondary | ICD-10-CM | POA: Diagnosis not present

## 2014-04-20 DIAGNOSIS — D692 Other nonthrombocytopenic purpura: Secondary | ICD-10-CM | POA: Diagnosis not present

## 2014-04-20 DIAGNOSIS — I789 Disease of capillaries, unspecified: Secondary | ICD-10-CM | POA: Diagnosis not present

## 2014-04-20 DIAGNOSIS — L570 Actinic keratosis: Secondary | ICD-10-CM | POA: Diagnosis not present

## 2014-05-31 DIAGNOSIS — M47817 Spondylosis without myelopathy or radiculopathy, lumbosacral region: Secondary | ICD-10-CM | POA: Diagnosis not present

## 2014-05-31 DIAGNOSIS — M4310 Spondylolisthesis, site unspecified: Secondary | ICD-10-CM | POA: Diagnosis not present

## 2014-05-31 DIAGNOSIS — IMO0002 Reserved for concepts with insufficient information to code with codable children: Secondary | ICD-10-CM | POA: Diagnosis not present

## 2014-08-15 ENCOUNTER — Encounter: Payer: Self-pay | Admitting: *Deleted

## 2014-09-03 DIAGNOSIS — Z23 Encounter for immunization: Secondary | ICD-10-CM | POA: Diagnosis not present

## 2014-09-06 ENCOUNTER — Ambulatory Visit (INDEPENDENT_AMBULATORY_CARE_PROVIDER_SITE_OTHER): Payer: Medicare Other | Admitting: Certified Nurse Midwife

## 2014-09-06 ENCOUNTER — Encounter: Payer: Self-pay | Admitting: Certified Nurse Midwife

## 2014-09-06 VITALS — BP 122/80 | HR 72 | Resp 16 | Ht 62.25 in | Wt 136.0 lb

## 2014-09-06 DIAGNOSIS — Z853 Personal history of malignant neoplasm of breast: Secondary | ICD-10-CM

## 2014-09-06 DIAGNOSIS — Z124 Encounter for screening for malignant neoplasm of cervix: Secondary | ICD-10-CM | POA: Diagnosis not present

## 2014-09-06 DIAGNOSIS — Z01419 Encounter for gynecological examination (general) (routine) without abnormal findings: Secondary | ICD-10-CM | POA: Diagnosis not present

## 2014-09-06 DIAGNOSIS — Z4689 Encounter for fitting and adjustment of other specified devices: Secondary | ICD-10-CM | POA: Diagnosis not present

## 2014-09-06 NOTE — Patient Instructions (Addendum)

## 2014-09-06 NOTE — Progress Notes (Signed)
78 y.o. G2P2 Married Caucasian Fe here for annual exam. Menopausal no HRT. Denies vaginal bleeding or vaginal dryness. Patient working on regular exercise daily. Sees PCP for aex and medication management. Patient continues with Evista and questions regarding continued use. Patient does not use when traveling due the DVT risk and feels this is a good choice for bone support as well as breast cancer risk. BMD not due until early 2016. Living at [place] now and settling in. Pessary working well for cystocele support. Removes daily and reinserts as needed. Denies any vaginal bleeding or dryness. Uses Olive Oil. Incontinence has decreased with use. No other health issues today.   Patient's last menstrual period was 11/20/1987.          Sexually active: No.  The current method of family planning is vasectomy.    Exercising: Yes.    pilates,yoga,weights & cardio Smoker:  no  Health Maintenance: Pap: 05-12-12 neg MMG: 2014 normal per patient Colonoscopy:  2014 BMD:   2014  TDaP:  Up to date per patient Labs: pcp Self breast exam: done occ   reports that she quit smoking about 51 years ago. She has never used smokeless tobacco. She reports that she drinks about 5 ounces of alcohol per week. She reports that she does not use illicit drugs.  Past Medical History  Diagnosis Date  . Osteoporosis   . GERD (gastroesophageal reflux disease) 2000  . Cancer 1998    Left breast Lumpectomy, Chemo,Rad  . Anorgasmia of female   . DDD (degenerative disc disease)   . Cataract 2010 2011    Past Surgical History  Procedure Laterality Date  . Breast surgery  3/98    lumpectomy (left)  . Cataract extraction Bilateral 2010 2011  . Synovial cyst excision      lumbar  . 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 aspirin 81 MG tablet Take 81 mg by mouth daily.      . chlorpheniramine (CHLOR-TRIMETON) 4 MG tablet Take 4 mg by  mouth 2 (two) times daily as needed for allergies.      . Ginger, Zingiber officinalis, (GINGER PO) Take by mouth.      . oxybutynin (OXYTROL) 3.9 MG/24HR Place 1 patch onto the skin every 3 (three) days.      . Oxymetazoline HCl (NASAL SPRAY NA) Place into the nose daily.      . raloxifene (EVISTA) 60 MG tablet Take 1 tablet (60 mg total) by mouth daily.  30 tablet  6   No current facility-administered medications for this visit.    Family History  Problem Relation Age of Onset  . Osteoporosis Mother   . Heart disease Father     ROS:  Pertinent items are noted in HPI.  Otherwise, a comprehensive ROS was negative.  Exam:   BP 122/80  Pulse 72  Resp 16  Ht 5' 2.25" (1.581 m)  Wt 136 lb (61.689 kg)  BMI 24.68 kg/m2  LMP 11/20/1987 Height: 5' 2.25" (158.1 cm)  Ht Readings from Last 3 Encounters:  09/06/14 5' 2.25" (1.581 m)  07/17/13 5' 2.5" (1.588 m)  12/13/10 5\' 3"  (1.6 m)    General appearance: alert, cooperative and appears stated age Head: Normocephalic, without obvious abnormality, atraumatic Neck: no adenopathy, supple, symmetrical, trachea midline and thyroid normal to inspection and palpation Lungs: clear to auscultation bilaterally Breasts: normal appearance, no masses or tenderness,  No nipple retraction or dimpling, No nipple discharge or bleeding, No axillary or supraclavicular adenopathy Heart: regular rate and rhythm Abdomen: soft, non-tender; no masses,  no organomegaly Extremities: extremities normal, atraumatic, no cyanosis or edema Skin: Skin color, texture, turgor normal. No rashes or lesions Lymph nodes: Cervical, supraclavicular, and axillary nodes normal. No abnormal inguinal nodes palpated Neurologic: Grossly normal   Pelvic: External genitalia:  no lesions              Urethra:  normal appearing urethra with no masses, tenderness or lesions              Bartholin's and Skene's: normal                 Vagina: normal appearing vagina with normal  color and discharge, no lesions, pessary present removed. No ulcerations or excoriations noted, grade 3 cystocele noted              Cervix: normal, non tender, no ulcerations or excoriations              Pap taken: Yes.   Bimanual Exam:  Uterus:  normal size, contour, position, consistency, mobility, non-tender and anteverted              Adnexa: normal adnexa and no mass, fullness, tenderness               Rectovaginal: Confirms               Anus:  normal sphincter tone, no lesions  A:  Well Woman with normal exam  Menopausal no HRT  Cystocele grade 3 with  Milex #4 ring pessary use with support, working well  Osteopenia/osteoarthritis on Evista  History of left breast cancer  P:   Reviewed health and wellness pertinent to exam  Aware to advise if vaginal bleeding   Pessary removed and cleaned and reinserted with good fit noted. Patient voided without problems. Patient to call if problems with.  Patient will advise if she desires continuance of Evista for Rx. Discussed risks and benefits and really not able to evaluate until next year BMD. Questions addressed.  Continue aex/labs with PCP.  Stressed yearly mammogram and SBE  Pap smear taken today  counseled on breast self exam, mammography screening, osteoporosis, adequate intake of calcium and vitamin D, diet and exercise, Kegel's exercises  return annually or prn  An After Visit Summary was printed and given to the patient.

## 2014-09-08 LAB — IPS PAP SMEAR ONLY

## 2014-09-10 DIAGNOSIS — Z1231 Encounter for screening mammogram for malignant neoplasm of breast: Secondary | ICD-10-CM | POA: Diagnosis not present

## 2014-09-10 DIAGNOSIS — Z853 Personal history of malignant neoplasm of breast: Secondary | ICD-10-CM | POA: Diagnosis not present

## 2014-09-10 NOTE — Progress Notes (Signed)
Reviewed personally.  M. Suzanne Sommer Spickard, MD.  

## 2014-09-20 ENCOUNTER — Encounter: Payer: Self-pay | Admitting: Certified Nurse Midwife

## 2014-09-22 DIAGNOSIS — Z85828 Personal history of other malignant neoplasm of skin: Secondary | ICD-10-CM | POA: Diagnosis not present

## 2014-09-22 DIAGNOSIS — D2339 Other benign neoplasm of skin of other parts of face: Secondary | ICD-10-CM | POA: Diagnosis not present

## 2014-09-22 DIAGNOSIS — L570 Actinic keratosis: Secondary | ICD-10-CM | POA: Diagnosis not present

## 2014-09-22 DIAGNOSIS — D485 Neoplasm of uncertain behavior of skin: Secondary | ICD-10-CM | POA: Diagnosis not present

## 2014-10-04 DIAGNOSIS — E780 Pure hypercholesterolemia: Secondary | ICD-10-CM | POA: Diagnosis not present

## 2014-10-04 DIAGNOSIS — Z0000 Encounter for general adult medical examination without abnormal findings: Secondary | ICD-10-CM | POA: Diagnosis not present

## 2014-10-04 DIAGNOSIS — Z136 Encounter for screening for cardiovascular disorders: Secondary | ICD-10-CM | POA: Diagnosis not present

## 2014-10-04 DIAGNOSIS — R32 Unspecified urinary incontinence: Secondary | ICD-10-CM | POA: Diagnosis not present

## 2014-10-04 DIAGNOSIS — Z1389 Encounter for screening for other disorder: Secondary | ICD-10-CM | POA: Diagnosis not present

## 2014-10-04 DIAGNOSIS — Z23 Encounter for immunization: Secondary | ICD-10-CM | POA: Diagnosis not present

## 2014-10-04 DIAGNOSIS — M545 Low back pain: Secondary | ICD-10-CM | POA: Diagnosis not present

## 2014-10-04 DIAGNOSIS — K210 Gastro-esophageal reflux disease with esophagitis: Secondary | ICD-10-CM | POA: Diagnosis not present

## 2014-10-05 ENCOUNTER — Other Ambulatory Visit: Payer: Self-pay | Admitting: Certified Nurse Midwife

## 2014-10-05 NOTE — Telephone Encounter (Signed)
Will need to look at BMD also

## 2014-10-05 NOTE — Telephone Encounter (Signed)
Last refilled: 12/31/13 By Ms. Debbie Last AEX: 09/06/14 with Ms. Debbie AEX Scheduled: no AEX scheduled for 2016  Please Advise.

## 2014-10-06 NOTE — Telephone Encounter (Signed)
BMD is in epic from 07/15/13

## 2014-10-06 NOTE — Telephone Encounter (Signed)
S/w patient she said she plans on being on the evista until her next BMD next year, please advise.

## 2014-10-06 NOTE — Telephone Encounter (Signed)
Patient is aware 

## 2014-10-06 NOTE — Telephone Encounter (Signed)
Will refill until that time. Rx signed

## 2014-10-06 NOTE — Telephone Encounter (Signed)
Per my note she was contemplating stopping and could not advise until bone density done in 2016. She needs a call if this is just a pharmacy request.

## 2014-10-19 DIAGNOSIS — H59091 Other disorders of the right eye following cataract surgery: Secondary | ICD-10-CM | POA: Diagnosis not present

## 2014-10-19 DIAGNOSIS — Z961 Presence of intraocular lens: Secondary | ICD-10-CM | POA: Diagnosis not present

## 2014-10-19 DIAGNOSIS — L718 Other rosacea: Secondary | ICD-10-CM | POA: Diagnosis not present

## 2014-10-19 DIAGNOSIS — D2261 Melanocytic nevi of right upper limb, including shoulder: Secondary | ICD-10-CM | POA: Diagnosis not present

## 2014-10-19 DIAGNOSIS — Z85828 Personal history of other malignant neoplasm of skin: Secondary | ICD-10-CM | POA: Diagnosis not present

## 2014-10-19 DIAGNOSIS — H3531 Nonexudative age-related macular degeneration: Secondary | ICD-10-CM | POA: Diagnosis not present

## 2014-10-19 DIAGNOSIS — L570 Actinic keratosis: Secondary | ICD-10-CM | POA: Diagnosis not present

## 2014-10-19 DIAGNOSIS — L821 Other seborrheic keratosis: Secondary | ICD-10-CM | POA: Diagnosis not present

## 2014-11-26 DIAGNOSIS — L570 Actinic keratosis: Secondary | ICD-10-CM | POA: Diagnosis not present

## 2014-12-20 DIAGNOSIS — H02822 Cysts of right lower eyelid: Secondary | ICD-10-CM | POA: Diagnosis not present

## 2014-12-20 DIAGNOSIS — H0012 Chalazion right lower eyelid: Secondary | ICD-10-CM | POA: Diagnosis not present

## 2015-01-06 ENCOUNTER — Other Ambulatory Visit: Payer: Self-pay | Admitting: Certified Nurse Midwife

## 2015-01-07 NOTE — Telephone Encounter (Signed)
Medication refill request: Evista 60 mg Last AEX:  09/06/14 DL Next AEX: Not scheduled  Last MMG (if hormonal medication request): 12/11/13 BIRADS2:Benign  Refill authorized: 10/06/14 #90/0R. Today #90/2R?   Regina Eck, CNM at 10/06/2014 12:05 PM     Status: Signed       Expand All Collapse All   Will refill until that time. Rx signed            Olga Millers, CMA at 10/06/2014 9:23 AM     Status: Signed       Expand All Collapse All   S/w patient she said she plans on being on the evista until her next BMD next year, please advise.       Last BMD 07/15/13.

## 2015-01-07 NOTE — Telephone Encounter (Signed)
Will leave this for DL to decide.

## 2015-01-10 NOTE — Telephone Encounter (Signed)
BMD report states she needs one in 2 years 06/2013  Next Due 07/16/15.  Patient stated she wanted to continue Rx until BMD due Unless Mrs Jackelyn Poling thinks patient is better off of Rx?

## 2015-01-10 NOTE — Telephone Encounter (Signed)
See previous note

## 2015-01-10 NOTE — Telephone Encounter (Signed)
Forwarded this to DL.

## 2015-01-10 NOTE — Telephone Encounter (Signed)
Patient was to have BMD so she could decide if she desired continuance. Needs phone call regarding this

## 2015-01-10 NOTE — Telephone Encounter (Signed)
Patient notified

## 2015-01-10 NOTE — Telephone Encounter (Signed)
Let's continue until BMD. Refills sent

## 2015-01-17 DIAGNOSIS — L570 Actinic keratosis: Secondary | ICD-10-CM | POA: Diagnosis not present

## 2015-02-07 DIAGNOSIS — M5416 Radiculopathy, lumbar region: Secondary | ICD-10-CM | POA: Diagnosis not present

## 2015-02-07 DIAGNOSIS — M47816 Spondylosis without myelopathy or radiculopathy, lumbar region: Secondary | ICD-10-CM | POA: Diagnosis not present

## 2015-02-07 DIAGNOSIS — M4726 Other spondylosis with radiculopathy, lumbar region: Secondary | ICD-10-CM | POA: Diagnosis not present

## 2015-02-07 DIAGNOSIS — M4316 Spondylolisthesis, lumbar region: Secondary | ICD-10-CM | POA: Diagnosis not present

## 2015-02-18 DIAGNOSIS — M47816 Spondylosis without myelopathy or radiculopathy, lumbar region: Secondary | ICD-10-CM | POA: Diagnosis not present

## 2015-02-18 DIAGNOSIS — M4806 Spinal stenosis, lumbar region: Secondary | ICD-10-CM | POA: Diagnosis not present

## 2015-02-21 DIAGNOSIS — R030 Elevated blood-pressure reading, without diagnosis of hypertension: Secondary | ICD-10-CM | POA: Diagnosis not present

## 2015-02-21 DIAGNOSIS — M4316 Spondylolisthesis, lumbar region: Secondary | ICD-10-CM | POA: Diagnosis not present

## 2015-03-24 DIAGNOSIS — M545 Low back pain: Secondary | ICD-10-CM | POA: Diagnosis not present

## 2015-04-04 DIAGNOSIS — L720 Epidermal cyst: Secondary | ICD-10-CM | POA: Diagnosis not present

## 2015-04-04 DIAGNOSIS — D692 Other nonthrombocytopenic purpura: Secondary | ICD-10-CM | POA: Diagnosis not present

## 2015-04-04 DIAGNOSIS — L570 Actinic keratosis: Secondary | ICD-10-CM | POA: Diagnosis not present

## 2015-04-04 DIAGNOSIS — Z85828 Personal history of other malignant neoplasm of skin: Secondary | ICD-10-CM | POA: Diagnosis not present

## 2015-04-12 DIAGNOSIS — M545 Low back pain: Secondary | ICD-10-CM | POA: Diagnosis not present

## 2015-07-14 ENCOUNTER — Telehealth: Payer: Self-pay | Admitting: Certified Nurse Midwife

## 2015-07-14 NOTE — Telephone Encounter (Signed)
Message not needed. °

## 2015-07-19 DIAGNOSIS — R634 Abnormal weight loss: Secondary | ICD-10-CM | POA: Diagnosis not present

## 2015-07-19 DIAGNOSIS — I499 Cardiac arrhythmia, unspecified: Secondary | ICD-10-CM | POA: Diagnosis not present

## 2015-07-19 DIAGNOSIS — R41840 Attention and concentration deficit: Secondary | ICD-10-CM | POA: Diagnosis not present

## 2015-07-20 ENCOUNTER — Ambulatory Visit (INDEPENDENT_AMBULATORY_CARE_PROVIDER_SITE_OTHER): Payer: Medicare Other

## 2015-07-20 DIAGNOSIS — I499 Cardiac arrhythmia, unspecified: Secondary | ICD-10-CM

## 2015-08-16 DIAGNOSIS — R413 Other amnesia: Secondary | ICD-10-CM | POA: Diagnosis not present

## 2015-08-16 DIAGNOSIS — I491 Atrial premature depolarization: Secondary | ICD-10-CM | POA: Diagnosis not present

## 2015-08-16 DIAGNOSIS — R41840 Attention and concentration deficit: Secondary | ICD-10-CM | POA: Diagnosis not present

## 2015-09-13 ENCOUNTER — Telehealth: Payer: Self-pay | Admitting: Certified Nurse Midwife

## 2015-09-13 DIAGNOSIS — Z853 Personal history of malignant neoplasm of breast: Secondary | ICD-10-CM | POA: Diagnosis not present

## 2015-09-13 DIAGNOSIS — M8589 Other specified disorders of bone density and structure, multiple sites: Secondary | ICD-10-CM | POA: Diagnosis not present

## 2015-09-13 DIAGNOSIS — Z1231 Encounter for screening mammogram for malignant neoplasm of breast: Secondary | ICD-10-CM | POA: Diagnosis not present

## 2015-09-13 NOTE — Telephone Encounter (Signed)
Left message on voicemail to call and reschedule cancelled appointment. °

## 2015-09-16 ENCOUNTER — Ambulatory Visit (INDEPENDENT_AMBULATORY_CARE_PROVIDER_SITE_OTHER): Payer: Medicare Other

## 2015-09-16 DIAGNOSIS — Z23 Encounter for immunization: Secondary | ICD-10-CM | POA: Diagnosis not present

## 2015-09-23 ENCOUNTER — Telehealth: Payer: Self-pay

## 2015-09-23 NOTE — Telephone Encounter (Signed)
Called pt to give her BMD results. Osteopenia in left arm. No significant change in hip neck. Spine a slight change. Continue vit d, exercise (weight bearing), yoga & calcium. Continue Evista if no health change. Pt notified. See scanned in results.

## 2015-09-28 ENCOUNTER — Ambulatory Visit: Payer: Medicare Other | Admitting: Certified Nurse Midwife

## 2015-10-06 DIAGNOSIS — Z961 Presence of intraocular lens: Secondary | ICD-10-CM | POA: Diagnosis not present

## 2015-10-06 DIAGNOSIS — H04123 Dry eye syndrome of bilateral lacrimal glands: Secondary | ICD-10-CM | POA: Diagnosis not present

## 2015-10-06 DIAGNOSIS — H5213 Myopia, bilateral: Secondary | ICD-10-CM | POA: Diagnosis not present

## 2015-10-06 DIAGNOSIS — H524 Presbyopia: Secondary | ICD-10-CM | POA: Diagnosis not present

## 2015-10-11 ENCOUNTER — Ambulatory Visit: Payer: Medicare Other | Admitting: Certified Nurse Midwife

## 2015-10-24 ENCOUNTER — Other Ambulatory Visit: Payer: Self-pay | Admitting: Certified Nurse Midwife

## 2015-10-24 NOTE — Telephone Encounter (Signed)
Medication refill request: evista  Last AEX:  09/06/14 DL Next AEX: 11/03/15 DL Last MMG (if hormonal medication request): 09/11/15 BIRADS2: Benign  Refill authorized: 01/10/15 #90tabs/2R. Today please advise.  Routed to [facility]

## 2015-10-28 DIAGNOSIS — D692 Other nonthrombocytopenic purpura: Secondary | ICD-10-CM | POA: Diagnosis not present

## 2015-10-28 DIAGNOSIS — L718 Other rosacea: Secondary | ICD-10-CM | POA: Diagnosis not present

## 2015-10-28 DIAGNOSIS — D2239 Melanocytic nevi of other parts of face: Secondary | ICD-10-CM | POA: Diagnosis not present

## 2015-10-28 DIAGNOSIS — L821 Other seborrheic keratosis: Secondary | ICD-10-CM | POA: Diagnosis not present

## 2015-10-28 DIAGNOSIS — L570 Actinic keratosis: Secondary | ICD-10-CM | POA: Diagnosis not present

## 2015-10-28 DIAGNOSIS — L814 Other melanin hyperpigmentation: Secondary | ICD-10-CM | POA: Diagnosis not present

## 2015-10-28 DIAGNOSIS — Z85828 Personal history of other malignant neoplasm of skin: Secondary | ICD-10-CM | POA: Diagnosis not present

## 2015-11-01 DIAGNOSIS — M5416 Radiculopathy, lumbar region: Secondary | ICD-10-CM | POA: Diagnosis not present

## 2015-11-01 DIAGNOSIS — M4316 Spondylolisthesis, lumbar region: Secondary | ICD-10-CM | POA: Diagnosis not present

## 2015-11-03 ENCOUNTER — Encounter: Payer: Self-pay | Admitting: Certified Nurse Midwife

## 2015-11-03 ENCOUNTER — Ambulatory Visit (INDEPENDENT_AMBULATORY_CARE_PROVIDER_SITE_OTHER): Payer: Medicare Other | Admitting: Certified Nurse Midwife

## 2015-11-03 VITALS — BP 120/64 | HR 72 | Resp 16 | Ht 62.25 in | Wt 133.0 lb

## 2015-11-03 DIAGNOSIS — Z01419 Encounter for gynecological examination (general) (routine) without abnormal findings: Secondary | ICD-10-CM | POA: Diagnosis not present

## 2015-11-03 DIAGNOSIS — Z4689 Encounter for fitting and adjustment of other specified devices: Secondary | ICD-10-CM | POA: Diagnosis not present

## 2015-11-03 DIAGNOSIS — Z124 Encounter for screening for malignant neoplasm of cervix: Secondary | ICD-10-CM

## 2015-11-03 DIAGNOSIS — N8110 Cystocele, unspecified: Secondary | ICD-10-CM | POA: Diagnosis not present

## 2015-11-03 DIAGNOSIS — IMO0002 Reserved for concepts with insufficient information to code with codable children: Secondary | ICD-10-CM

## 2015-11-03 NOTE — Progress Notes (Signed)
79 y.o. G2P2 Married  Caucasian Fe here for annual exam.  Menopausal no HRT. Denies vaginal bleeding or vaginal dryness. Continues to have occasional urinary urgency and occasional loss of urine, no issues. Sees Dr.Griffen for aex/labs,cholesterol. Patient had BMD with osteopenia and continues on Evista with no issues. Pessary working well for vaginal support. Denies UTI symptoms and is having no issues with insertion or removal. Uses Olive oil for moisture. Sees Orthopedic for degenerative disc management. No health issues today. Living at [place] now and enjoying it.  Patient's last menstrual period was 11/20/1987.          Sexually active: No.  The current method of family planning is vasectomy.    Exercising: Yes.    yoga, pilates, weights, cardio Smoker:  no  Health Maintenance: Pap: 09-06-14 neg MMG: 09-13-15 category b density birads 2:neg Colonoscopy:  2014 BMD:   09-13-15 osteopenia TDaP:  Up to date Shingles: 2009 Pneumonia: 2001 Hep C and HIV: HIV & Hep c-unsure, declines Labs: pcp Self breast exam: done occ   reports that she quit smoking about 52 years ago. She has never used smokeless tobacco. She reports that she drinks about 9.0 oz of alcohol per week. She reports that she does not use illicit drugs.  Past Medical History  Diagnosis Date  . Osteoporosis   . GERD (gastroesophageal reflux disease) 2000  . Cancer ([place]) 1998    Left breast Lumpectomy, Chemo,Rad  . Anorgasmia of female   . DDD (degenerative disc disease)   . Cataract 2010 2011    Past Surgical History  Procedure Laterality Date  . Breast surgery  3/98    lumpectomy (left)  . Cataract extraction Bilateral 2010 2011  . Synovial cyst excision      lumbar  . 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 CALCIUM PO Take by mouth daily.    . chlorpheniramine (CHLOR-TRIMETON) 4 MG tablet Take 4 mg by mouth at bedtime.     .  Ginger, Zingiber officinalis, (GINGER PO) Take by mouth. crystalized    . MAGNESIUM PO Take by mouth daily.    . mirabegron ER (MYRBETRIQ) 25 MG TB24 tablet Take 25 mg by mouth daily.    . Omega-3 Fatty Acids (FISH OIL PO) Take by mouth daily.    . pantoprazole (PROTONIX) 40 MG tablet Take 40 mg by mouth daily.    . Probiotic Product (PROBIOTIC PO) Take by mouth daily.    . raloxifene (EVISTA) 60 MG tablet TAKE 1 TABLET DAILY. 30 tablet 0  . TURMERIC PO Take by mouth daily.    Marland Kitchen UNABLE TO FIND daily. rx nasal spray     No current facility-administered medications for this visit.    Family History  Problem Relation Age of Onset  . Osteoporosis Mother   . Heart disease Father     ROS:  Pertinent items are noted in HPI.  Otherwise, a comprehensive ROS was negative.  Exam:   BP 120/64 mmHg  Pulse 72  Resp 16  Ht 5' 2.25" (1.581 m)  Wt 133 lb (60.328 kg)  BMI 24.14 kg/m2  LMP 11/20/1987 Height: 5' 2.25" (158.1 cm) Ht Readings from Last 3 Encounters:  11/03/15 5' 2.25" (1.581 m)  09/06/14 5' 2.25" (1.581 m)  07/17/13 5' 2.5" (1.588 m)    General appearance: alert, cooperative and appears stated age Head: Normocephalic, without obvious abnormality, atraumatic Neck:  no adenopathy, supple, symmetrical, trachea midline and thyroid normal to inspection and palpation Lungs: clear to auscultation bilaterally Breasts: normal appearance, no masses or tenderness, No nipple retraction or dimpling, No nipple discharge or bleeding, No axillary or supraclavicular adenopathy Heart: regular rate and rhythm Abdomen: soft, non-tender; no masses,  no organomegaly Extremities: extremities normal, atraumatic, no cyanosis or edema Skin: Skin color, texture, turgor normal. No rashes or lesions Lymph nodes: Cervical, supraclavicular, and axillary nodes normal. No abnormal inguinal nodes palpated Neurologic: Grossly normal   Pelvic: External genitalia:  no lesions              Urethra:  normal  appearing urethra with no masses, tenderness or lesions              Bartholin's and Skene's: normal                 Vagina: normal appearing vagina with normal color and discharge, no lesions              Cervix: normal,non tender, no lesions              Pap taken: No. Bimanual Exam:  Uterus:  normal size, contour, position, consistency, mobility, non-tender              Adnexa: no mass, fullness, tenderness and adnexa not palpated               Rectovaginal: Confirms               Anus:  normal sphincter tone, no lesions  Chaperone present: yes  A:  Well Woman with normal exam  Menopausal no HRT  Cystocele grade 3 with pessary use for support that she manages Milex # 4 ring pessary with support  Osteoporosis/ now osteopenia noted on BMD with little change on BMD continues with Evista use, aware of DVT risk but feels it still is good choice.  History of Left breast cancer 1998  PCP management for cholesterol   P:   Reviewed health and wellness pertinent to exam  Menopausal no HRT  Continue with pessary use as needed. Warning signs reviewed and to advise if occurs. May try Coconut oil for use with and will advise if problems.  Will continue with Evista use and request labs from PCP and evaluate. Patient would like to continue due to breast cancer risk and  Osteoporosis benefit  Pap smear as above not taken   counseled on breast self exam, mammography screening, osteoporosis, adequate intake of calcium and vitamin D, diet and exercise  return annually or prn  An After Visit Summary was printed and given to the patient.

## 2015-11-03 NOTE — Patient Instructions (Signed)

## 2015-11-05 NOTE — Progress Notes (Signed)
Reviewed personally.  M. Suzanne Evalena Fujii, MD.  

## 2015-11-18 DIAGNOSIS — J329 Chronic sinusitis, unspecified: Secondary | ICD-10-CM | POA: Diagnosis not present

## 2015-11-18 DIAGNOSIS — R05 Cough: Secondary | ICD-10-CM | POA: Diagnosis not present

## 2015-12-01 DIAGNOSIS — A09 Infectious gastroenteritis and colitis, unspecified: Secondary | ICD-10-CM | POA: Diagnosis not present

## 2015-12-01 DIAGNOSIS — N390 Urinary tract infection, site not specified: Secondary | ICD-10-CM | POA: Diagnosis not present

## 2015-12-06 ENCOUNTER — Other Ambulatory Visit: Payer: Self-pay | Admitting: Nurse Practitioner

## 2015-12-06 ENCOUNTER — Ambulatory Visit: Payer: Medicare Other | Admitting: Audiology

## 2015-12-06 NOTE — Telephone Encounter (Signed)
Medication refill request: Raloxifene 60mg  tabs Last AEX:  11/03/2015 PG Next AEX: 11/22/16 PG Last MMG (if hormonal medication request): 09/11/15 BIRADS Category 2 Benign Refill authorized: 10/24/15 #30 tabs 0 Refills  Today: #30 tabs 0 Refills?  Please advise

## 2015-12-06 NOTE — Telephone Encounter (Signed)
Debbie please look at this RX and see if you wish to continue Evista?

## 2016-01-27 ENCOUNTER — Ambulatory Visit
Admission: RE | Admit: 2016-01-27 | Discharge: 2016-01-27 | Disposition: A | Discharge status: Home / Self Care | Payer: Medicare Other | Source: Ambulatory Visit | Attending: Nurse Practitioner | Admitting: Nurse Practitioner

## 2016-01-27 ENCOUNTER — Other Ambulatory Visit: Payer: Self-pay | Admitting: Nurse Practitioner

## 2016-01-27 DIAGNOSIS — R109 Unspecified abdominal pain: Secondary | ICD-10-CM

## 2016-01-27 DIAGNOSIS — R1084 Generalized abdominal pain: Secondary | ICD-10-CM | POA: Diagnosis not present

## 2016-01-27 DIAGNOSIS — R140 Abdominal distension (gaseous): Secondary | ICD-10-CM | POA: Diagnosis not present

## 2016-01-27 DIAGNOSIS — R5383 Other fatigue: Secondary | ICD-10-CM | POA: Diagnosis not present

## 2016-02-28 DIAGNOSIS — Z1389 Encounter for screening for other disorder: Secondary | ICD-10-CM | POA: Diagnosis not present

## 2016-02-28 DIAGNOSIS — R030 Elevated blood-pressure reading, without diagnosis of hypertension: Secondary | ICD-10-CM | POA: Diagnosis not present

## 2016-02-28 DIAGNOSIS — H9190 Unspecified hearing loss, unspecified ear: Secondary | ICD-10-CM | POA: Diagnosis not present

## 2016-02-28 DIAGNOSIS — M8588 Other specified disorders of bone density and structure, other site: Secondary | ICD-10-CM | POA: Diagnosis not present

## 2016-02-28 DIAGNOSIS — Z0000 Encounter for general adult medical examination without abnormal findings: Secondary | ICD-10-CM | POA: Diagnosis not present

## 2016-02-28 DIAGNOSIS — K219 Gastro-esophageal reflux disease without esophagitis: Secondary | ICD-10-CM | POA: Diagnosis not present

## 2016-02-28 DIAGNOSIS — E7800 Pure hypercholesterolemia, unspecified: Secondary | ICD-10-CM | POA: Diagnosis not present

## 2016-05-15 DIAGNOSIS — M5416 Radiculopathy, lumbar region: Secondary | ICD-10-CM | POA: Diagnosis not present

## 2016-07-06 DIAGNOSIS — S81812A Laceration without foreign body, left lower leg, initial encounter: Secondary | ICD-10-CM | POA: Diagnosis not present

## 2016-07-18 DIAGNOSIS — L03116 Cellulitis of left lower limb: Secondary | ICD-10-CM | POA: Diagnosis not present

## 2016-07-18 DIAGNOSIS — S81812D Laceration without foreign body, left lower leg, subsequent encounter: Secondary | ICD-10-CM | POA: Diagnosis not present

## 2016-07-24 DIAGNOSIS — S81812D Laceration without foreign body, left lower leg, subsequent encounter: Secondary | ICD-10-CM | POA: Diagnosis not present

## 2016-07-24 DIAGNOSIS — L03116 Cellulitis of left lower limb: Secondary | ICD-10-CM | POA: Diagnosis not present

## 2016-09-05 DIAGNOSIS — Z23 Encounter for immunization: Secondary | ICD-10-CM | POA: Diagnosis not present

## 2016-09-17 DIAGNOSIS — Z853 Personal history of malignant neoplasm of breast: Secondary | ICD-10-CM | POA: Diagnosis not present

## 2016-09-17 DIAGNOSIS — Z1231 Encounter for screening mammogram for malignant neoplasm of breast: Secondary | ICD-10-CM | POA: Diagnosis not present

## 2016-11-22 ENCOUNTER — Ambulatory Visit (INDEPENDENT_AMBULATORY_CARE_PROVIDER_SITE_OTHER): Payer: Medicare Other | Admitting: Certified Nurse Midwife

## 2016-11-22 ENCOUNTER — Encounter: Payer: Self-pay | Admitting: Certified Nurse Midwife

## 2016-11-22 VITALS — BP 130/78 | HR 70 | Resp 16 | Ht 62.25 in | Wt 136.0 lb

## 2016-11-22 DIAGNOSIS — R159 Full incontinence of feces: Secondary | ICD-10-CM | POA: Diagnosis not present

## 2016-11-22 DIAGNOSIS — N814 Uterovaginal prolapse, unspecified: Secondary | ICD-10-CM | POA: Diagnosis not present

## 2016-11-22 DIAGNOSIS — Z124 Encounter for screening for malignant neoplasm of cervix: Secondary | ICD-10-CM | POA: Diagnosis not present

## 2016-11-22 DIAGNOSIS — N3946 Mixed incontinence: Secondary | ICD-10-CM | POA: Diagnosis not present

## 2016-11-22 DIAGNOSIS — R152 Fecal urgency: Secondary | ICD-10-CM | POA: Diagnosis not present

## 2016-11-22 DIAGNOSIS — N816 Rectocele: Secondary | ICD-10-CM

## 2016-11-22 DIAGNOSIS — Z01419 Encounter for gynecological examination (general) (routine) without abnormal findings: Secondary | ICD-10-CM | POA: Diagnosis not present

## 2016-11-22 NOTE — Progress Notes (Signed)
81 y.o. G2P2 Married  Caucasian Fe here for annual exam. Menopausal no HRT. Denies vaginal bleeding or vaginal dryness. Patient has not been using pessary in the past 6 months due to not fitting well. Also not taken Mybetrique due to doesn't feel it was helping. Some bowel incontinence recently, but not while she was in [place]. ?altitude change per patient. Taking vinegar/water for GERD, instead of Protonix working well Sees Dr. Laurann Montana yearly for aex and labs and medication management if needed. Working on pelvic floor exercises to help with relaxation and also doing pilates and yoga for bone strength. Taking calcium daily ? 600 mg with Vitamin D. Has Vitamin D check with PCP. Unsure what if any changes she would make with vaginal support with prolapse. Does not want to use pessary and feels surgery is not for her. Wears a pad daily and does not always need one. Will continue to use pad and work on exercise and will advise in 4-6 weeks if bowel issue changes.  Patient's last menstrual period was 11/20/1987.          Sexually active: No.  The current method of family planning is vasectomy.    Exercising: Yes.    yoga, pilates, weights Smoker:  no  Health Maintenance: Pap:  09-06-14 neg MMG:  09-17-16 category b density birads 2:neg Colonoscopy:  2014 due 2019 BMD:   2016 osteopenia TDaP:  Up to date with Dr. Laurann Montana Shingles: 2009 Pneumonia: 2001 Hep C and HIV: not done Labs: pcp Self breast exam: done occ   reports that she quit smoking about 53 years ago. She has never used smokeless tobacco. She reports that she drinks about 9.0 oz of alcohol per week . She reports that she does not use drugs.  Past Medical History:  Diagnosis Date  . Anorgasmia of female   . Cancer ([place]) 1998   Left breast Lumpectomy, Chemo,Rad  . Cataract 2010 2011  . DDD (degenerative disc disease)   . GERD (gastroesophageal reflux disease) 2000  . Osteoporosis     Past Surgical History:  Procedure  Laterality Date  . BREAST SURGERY  3/98   lumpectomy (left)  . CATARACT EXTRACTION Bilateral 2010 2011  . MOHS SURGERY  08/2009   nose, squamous cell ca  . SYNOVIAL CYST EXCISION     lumbar    Current Outpatient Prescriptions  Medication Sig Dispense Refill  . CALCIUM PO Take by mouth daily.    . chlorpheniramine (CHLOR-TRIMETON) 4 MG tablet Take 4 mg by mouth at bedtime.     . Ginger, Zingiber officinalis, (GINGER PO) Take by mouth. crystalized    . MAGNESIUM PO Take by mouth daily.    . mirabegron ER (MYRBETRIQ) 25 MG TB24 tablet Take 25 mg by mouth daily.    . Omega-3 Fatty Acids (FISH OIL PO) Take by mouth daily.    . pantoprazole (PROTONIX) 40 MG tablet Take 40 mg by mouth daily.    . Probiotic Product (PROBIOTIC PO) Take by mouth daily.    . raloxifene (EVISTA) 60 MG tablet TAKE 1 TABLET DAILY. 30 tablet 6  . TURMERIC PO Take by mouth daily.    Marland Kitchen UNABLE TO FIND daily. rx nasal spray     No current facility-administered medications for this visit.     Family History  Problem Relation Age of Onset  . Osteoporosis Mother   . Heart disease Father     ROS:  Pertinent items are noted in HPI.  Otherwise, a comprehensive  ROS was negative.  Exam:   LMP 11/20/1987    Ht Readings from Last 3 Encounters:  11/03/15 5' 2.25" (1.581 m)  09/06/14 5' 2.25" (1.581 m)  07/17/13 5' 2.5" (1.588 m)    General appearance: alert, cooperative and appears stated age Head: Normocephalic, without obvious abnormality, atraumatic Neck: no adenopathy, supple, symmetrical, trachea midline and thyroid normal to inspection and palpation Lungs: clear to auscultation bilaterally Breasts: normal appearance, no masses or tenderness, No nipple retraction or dimpling, No nipple discharge or bleeding, No axillary or supraclavicular adenopathy, pendelous Heart: regular rate and rhythm Abdomen: soft, non-tender; no masses,  no organomegaly Extremities: extremities normal, atraumatic, no cyanosis or  edema Skin: Skin color, texture, turgor normal. No rashes or lesions Lymph nodes: Cervical, supraclavicular, and axillary nodes normal. No abnormal inguinal nodes palpated Neurologic: Grossly normal   Pelvic: External genitalia:  no lesions              Urethra:  normal appearing urethra with no masses, tenderness or lesions              Bartholin's and Skene's: normal                 Vagina: normal appearing vagina with normal color and discharge, no lesions, cystocele grade 3 with uterine prolapse and rectocele grade 1-2              Cervix: no cervical motion tenderness, no lesions and normal appearance              Pap taken: Yes.   Bimanual Exam:  Uterus:  normal size, contour, position, consistency, mobility, non-tender and prolapse               Adnexa: normal adnexa and no mass, fullness, tenderness               Rectovaginal: Confirms               Anus:  normal sphincter tone, no lesions  Chaperone present: yes  A:  Well Woman with normal exam  Menopausal no HRT  Cystocele grade 3 with uterine prolapse and grade 1-2 rectocele, previous pessary use, declines use now  Mixed incontinence sporadic, with occasional bowel incontinence if not on regular schedule  Osteopenia with calcium intake and vitamin D with proactive exercise  P:   Reviewed health and wellness pertinent to exam  Aware of need to advise if vaginal bleeding  Discussed prolapse,cystocele/rectocele status and option for new pessary fitting, surgical consult or continue to manage with fiber for normal stools, and pelvic floor exercise. Patient does not want another pessary."[place] with pad use and regular bowel movements now". Patient will advise if desires further evaluation for other treatment options. Warning signs of incontinence and UTI given and need to have adequate water intake daily. Questions addressed at length.  Continue calcium/vitamin D supplement and good daily intake with exercise for bone support.  Repeat BMD in one year.  Pap smear as above   counseled on breast self exam, mammography screening, osteoporosis, adequate intake of calcium and vitamin D, diet and exercise, Kegel's exercises  return annually or prn  An After Visit Summary was printed and given to the patient.

## 2016-11-22 NOTE — Patient Instructions (Signed)
EXERCISE AND DIET:  We recommended that you start or continue a regular exercise program for good health. Regular exercise means any activity that makes your heart beat faster and makes you sweat.  We recommend exercising at least 30 minutes per day at least 3 days a week, preferably 4 or 5.  We also recommend a diet low in fat and sugar.  Inactivity, poor dietary choices and obesity can cause diabetes, heart attack, stroke, and kidney damage, among others.    ALCOHOL AND SMOKING:  Women should limit their alcohol intake to no more than 7 drinks/beers/glasses of wine (combined, not each!) per week. Moderation of alcohol intake to this level decreases your risk of breast cancer and liver damage. And of course, no recreational drugs are part of a healthy lifestyle.  And absolutely no smoking or even second hand smoke. Most people know smoking can cause heart and lung diseases, but did you know it also contributes to weakening of your bones? Aging of your skin?  Yellowing of your teeth and nails?  CALCIUM AND VITAMIN D:  Adequate intake of calcium and Vitamin D are recommended.  The recommendations for exact amounts of these supplements seem to change often, but generally speaking 600 mg of calcium (either carbonate or citrate) and 800 units of Vitamin D per day seems prudent. Certain women may benefit from higher intake of Vitamin D.  If you are among these women, your doctor will have told you during your visit.    PAP SMEARS:  Pap smears, to check for cervical cancer or precancers,  have traditionally been done yearly, although recent scientific advances have shown that most women can have pap smears less often.  However, every woman still should have a physical exam from her gynecologist every year. It will include a breast check, inspection of the vulva and vagina to check for abnormal growths or skin changes, a visual exam of the cervix, and then an exam to evaluate the size and shape of the uterus and  ovaries.  And after 79 years of age, a rectal exam is indicated to check for rectal cancers. We will also provide age appropriate advice regarding health maintenance, like when you should have certain vaccines, screening for sexually transmitted diseases, bone density testing, colonoscopy, mammograms, etc.   MAMMOGRAMS:  All women over 40 years old should have a yearly mammogram. Many facilities now offer a "3D" mammogram, which may cost around $50 extra out of pocket. If possible,  we recommend you accept the option to have the 3D mammogram performed.  It both reduces the number of women who will be called back for extra views which then turn out to be normal, and it is better than the routine mammogram at detecting truly abnormal areas.    COLONOSCOPY:  Colonoscopy to screen for colon cancer is recommended for all women at age 50.  We know, you hate the idea of the prep.  We agree, BUT, having colon cancer and not knowing it is worse!!  Colon cancer so often starts as a polyp that can be seen and removed at colonscopy, which can quite literally save your life!  And if your first colonoscopy is normal and you have no family history of colon cancer, most women don't have to have it again for 10 years.  Once every ten years, you can do something that may end up saving your life, right?  We will be happy to help you get it scheduled when you are ready.    Be sure to check your insurance coverage so you understand how much it will cost.  It may be covered as a preventative service at no cost, but you should check your particular policy.      Pelvic Organ Prolapse Introduction Pelvic organ prolapse is the stretching, bulging, or dropping of pelvic organs into an abnormal position. It happens when the muscles and tissues that surround and support pelvic structures are stretched or weak. Pelvic organ prolapse can involve:  Vagina (vaginal prolapse).  Uterus (uterine prolapse).  Bladder (cystocele).  Rectum  (rectocele).  Intestines (enterocele). When organs other than the vagina are involved, they often bulge into the vagina or protrude from the vagina, depending on how severe the prolapse is. What are the causes? Causes of this condition include:  Pregnancy, labor, and childbirth.  Long-lasting (chronic) cough.  Chronic constipation.  Obesity.  Past pelvic surgery.  Aging. During and after menopause, a decreased production of the hormone estrogen can weaken pelvic ligaments and muscles.  Consistently lifting more than 50 lb (23 kg).  Buildup of fluid in the abdomen due to certain diseases and other conditions. What are the signs or symptoms? Symptoms of this condition include:  Loss of bladder control when you cough, sneeze, strain, and exercise (stress incontinence). This may be worse immediately following childbirth, and it may gradually improve over time.  Feeling pressure in your pelvis or vagina. This pressure may increase when you cough or when you are having a bowel movement.  A bulge that protrudes from the opening of your vagina or against your vaginal wall. If your uterus protrudes through the opening of your vagina and rubs against your clothing, you may also experience soreness, ulcers, infection, pain, and bleeding.  Increased effort to have a bowel movement or urinate.  Pain in your low back.  Pain, discomfort, or disinterest in sexual intercourse.  Repeated bladder infections (urinary tract infections).  Difficulty inserting or inability to insert a tampon or applicator. In some people, this condition does not cause any symptoms. How is this diagnosed? Your health care provider may perform an internal and external vaginal and rectal exam. During the exam, you may be asked to cough and strain while you are lying down, sitting, and standing up. Your health care provider will determine if other tests are required, such as bladder function tests. How is this  treated? In most cases, this condition needs to be treated only if it produces symptoms. No treatment is guaranteed to correct the prolapse or relieve the symptoms completely. Treatment may include:  Lifestyle changes, such as:  Avoiding drinking beverages that contain caffeine.  Increasing your intake of high-fiber foods. This can help to decrease constipation and straining during bowel movements.  Emptying your bladder at scheduled times (bladder training therapy). This can help to reduce or avoid urinary incontinence.  Losing weight if you are overweight or obese.  Estrogen. Estrogen may help mild prolapse by increasing the strength and tone of pelvic floor muscles.  Kegel exercises. These may help mild cases of prolapse by strengthening and tightening the muscles of the pelvic floor.  Pessary insertion. A pessary is a soft, flexible device that is placed into your vagina by your health care provider to help support the vaginal walls and keep pelvic organs in place.  Surgery. This is often the only form of treatment for severe prolapse. Different types of surgeries are available. Follow these instructions at home:  Wear a sanitary pad or absorbent product if you  have urinary incontinence.  Avoid heavy lifting and straining with exercise and work. Do not hold your breath when you perform mild to moderate lifting and exercise activities. Limit your activities as directed by your health care provider.  Take medicines only as directed by your health care provider.  Perform Kegel exercises as directed by your health care provider.  If you have a pessary, take care of it as directed by your health care provider. Contact a health care provider if:  Your symptoms interfere with your daily activities or sex life.  You need medicine to help with the discomfort.  You notice bleeding from the vagina that is not related to your period.  You have a fever.  You have pain or bleeding when  you urinate.  You have bleeding when you have a bowel movement.  You lose urine when you have sex.  You have chronic constipation.  You have a pessary that falls out.  You have vaginal discharge that has a bad smell.  You have low abdominal pain or cramping that is unusual for you. This information is not intended to replace advice given to you by your health care provider. Make sure you discuss any questions you have with your health care provider. Document Released: 06/02/2014 Document Revised: 04/12/2016 Document Reviewed: 01/18/2014  2017 [employer]

## 2016-11-23 NOTE — Progress Notes (Signed)
Encounter reviewed [name], MD   

## 2016-11-26 LAB — IPS PAP SMEAR ONLY

## 2016-12-07 DIAGNOSIS — N390 Urinary tract infection, site not specified: Secondary | ICD-10-CM | POA: Diagnosis not present

## 2016-12-07 DIAGNOSIS — K219 Gastro-esophageal reflux disease without esophagitis: Secondary | ICD-10-CM | POA: Diagnosis not present

## 2016-12-21 DIAGNOSIS — M5416 Radiculopathy, lumbar region: Secondary | ICD-10-CM | POA: Diagnosis not present

## 2016-12-21 DIAGNOSIS — M5136 Other intervertebral disc degeneration, lumbar region: Secondary | ICD-10-CM | POA: Diagnosis not present

## 2016-12-21 DIAGNOSIS — M48061 Spinal stenosis, lumbar region without neurogenic claudication: Secondary | ICD-10-CM | POA: Diagnosis not present

## 2016-12-21 DIAGNOSIS — M4316 Spondylolisthesis, lumbar region: Secondary | ICD-10-CM | POA: Diagnosis not present

## 2016-12-21 DIAGNOSIS — M4726 Other spondylosis with radiculopathy, lumbar region: Secondary | ICD-10-CM | POA: Diagnosis not present

## 2016-12-31 DIAGNOSIS — M545 Low back pain: Secondary | ICD-10-CM | POA: Diagnosis not present

## 2016-12-31 DIAGNOSIS — R262 Difficulty in walking, not elsewhere classified: Secondary | ICD-10-CM | POA: Diagnosis not present

## 2016-12-31 DIAGNOSIS — M6281 Muscle weakness (generalized): Secondary | ICD-10-CM | POA: Diagnosis not present

## 2017-01-08 DIAGNOSIS — L817 Pigmented purpuric dermatosis: Secondary | ICD-10-CM | POA: Diagnosis not present

## 2017-01-08 DIAGNOSIS — D2271 Melanocytic nevi of right lower limb, including hip: Secondary | ICD-10-CM | POA: Diagnosis not present

## 2017-01-08 DIAGNOSIS — L814 Other melanin hyperpigmentation: Secondary | ICD-10-CM | POA: Diagnosis not present

## 2017-01-08 DIAGNOSIS — I788 Other diseases of capillaries: Secondary | ICD-10-CM | POA: Diagnosis not present

## 2017-01-08 DIAGNOSIS — L821 Other seborrheic keratosis: Secondary | ICD-10-CM | POA: Diagnosis not present

## 2017-01-08 DIAGNOSIS — L718 Other rosacea: Secondary | ICD-10-CM | POA: Diagnosis not present

## 2017-01-08 DIAGNOSIS — L570 Actinic keratosis: Secondary | ICD-10-CM | POA: Diagnosis not present

## 2017-01-08 DIAGNOSIS — Z85828 Personal history of other malignant neoplasm of skin: Secondary | ICD-10-CM | POA: Diagnosis not present

## 2017-01-10 DIAGNOSIS — M6281 Muscle weakness (generalized): Secondary | ICD-10-CM | POA: Diagnosis not present

## 2017-01-10 DIAGNOSIS — R262 Difficulty in walking, not elsewhere classified: Secondary | ICD-10-CM | POA: Diagnosis not present

## 2017-01-10 DIAGNOSIS — M545 Low back pain: Secondary | ICD-10-CM | POA: Diagnosis not present

## 2017-01-14 DIAGNOSIS — M545 Low back pain: Secondary | ICD-10-CM | POA: Diagnosis not present

## 2017-01-14 DIAGNOSIS — M6281 Muscle weakness (generalized): Secondary | ICD-10-CM | POA: Diagnosis not present

## 2017-01-14 DIAGNOSIS — R262 Difficulty in walking, not elsewhere classified: Secondary | ICD-10-CM | POA: Diagnosis not present

## 2017-01-17 DIAGNOSIS — M6281 Muscle weakness (generalized): Secondary | ICD-10-CM | POA: Diagnosis not present

## 2017-01-17 DIAGNOSIS — R262 Difficulty in walking, not elsewhere classified: Secondary | ICD-10-CM | POA: Diagnosis not present

## 2017-01-17 DIAGNOSIS — M545 Low back pain: Secondary | ICD-10-CM | POA: Diagnosis not present

## 2017-01-21 DIAGNOSIS — M6281 Muscle weakness (generalized): Secondary | ICD-10-CM | POA: Diagnosis not present

## 2017-01-21 DIAGNOSIS — R262 Difficulty in walking, not elsewhere classified: Secondary | ICD-10-CM | POA: Diagnosis not present

## 2017-01-21 DIAGNOSIS — M545 Low back pain: Secondary | ICD-10-CM | POA: Diagnosis not present

## 2017-01-24 DIAGNOSIS — R262 Difficulty in walking, not elsewhere classified: Secondary | ICD-10-CM | POA: Diagnosis not present

## 2017-01-24 DIAGNOSIS — M6281 Muscle weakness (generalized): Secondary | ICD-10-CM | POA: Diagnosis not present

## 2017-01-24 DIAGNOSIS — M545 Low back pain: Secondary | ICD-10-CM | POA: Diagnosis not present

## 2017-02-13 DIAGNOSIS — M2041 Other hammer toe(s) (acquired), right foot: Secondary | ICD-10-CM | POA: Diagnosis not present

## 2017-02-13 DIAGNOSIS — M21612 Bunion of left foot: Secondary | ICD-10-CM | POA: Diagnosis not present

## 2017-02-13 DIAGNOSIS — M21611 Bunion of right foot: Secondary | ICD-10-CM | POA: Diagnosis not present

## 2017-02-13 DIAGNOSIS — M2042 Other hammer toe(s) (acquired), left foot: Secondary | ICD-10-CM | POA: Diagnosis not present

## 2017-02-14 DIAGNOSIS — M545 Low back pain: Secondary | ICD-10-CM | POA: Diagnosis not present

## 2017-02-14 DIAGNOSIS — R262 Difficulty in walking, not elsewhere classified: Secondary | ICD-10-CM | POA: Diagnosis not present

## 2017-02-14 DIAGNOSIS — M6281 Muscle weakness (generalized): Secondary | ICD-10-CM | POA: Diagnosis not present

## 2017-02-28 DIAGNOSIS — R32 Unspecified urinary incontinence: Secondary | ICD-10-CM | POA: Diagnosis not present

## 2017-02-28 DIAGNOSIS — N814 Uterovaginal prolapse, unspecified: Secondary | ICD-10-CM | POA: Diagnosis not present

## 2017-02-28 DIAGNOSIS — N816 Rectocele: Secondary | ICD-10-CM | POA: Diagnosis not present

## 2017-02-28 DIAGNOSIS — Z0000 Encounter for general adult medical examination without abnormal findings: Secondary | ICD-10-CM | POA: Diagnosis not present

## 2017-02-28 DIAGNOSIS — M8588 Other specified disorders of bone density and structure, other site: Secondary | ICD-10-CM | POA: Diagnosis not present

## 2017-02-28 DIAGNOSIS — Z1389 Encounter for screening for other disorder: Secondary | ICD-10-CM | POA: Diagnosis not present

## 2017-02-28 DIAGNOSIS — N8110 Cystocele, unspecified: Secondary | ICD-10-CM | POA: Diagnosis not present

## 2017-02-28 DIAGNOSIS — Z86010 Personal history of colonic polyps: Secondary | ICD-10-CM | POA: Diagnosis not present

## 2017-03-13 DIAGNOSIS — H04123 Dry eye syndrome of bilateral lacrimal glands: Secondary | ICD-10-CM | POA: Diagnosis not present

## 2017-04-17 DIAGNOSIS — H04123 Dry eye syndrome of bilateral lacrimal glands: Secondary | ICD-10-CM | POA: Diagnosis not present

## 2017-04-17 DIAGNOSIS — H10409 Unspecified chronic conjunctivitis, unspecified eye: Secondary | ICD-10-CM | POA: Diagnosis not present

## 2017-05-28 IMAGING — CR DG ABDOMEN 1V
1 series · 1 of 1 positions shown · non-contrast
Comparison: None.

CLINICAL DATA: Generalized abdominal pain and bloating.

EXAM:
ABDOMEN - 1 VIEW

[view not recorded]
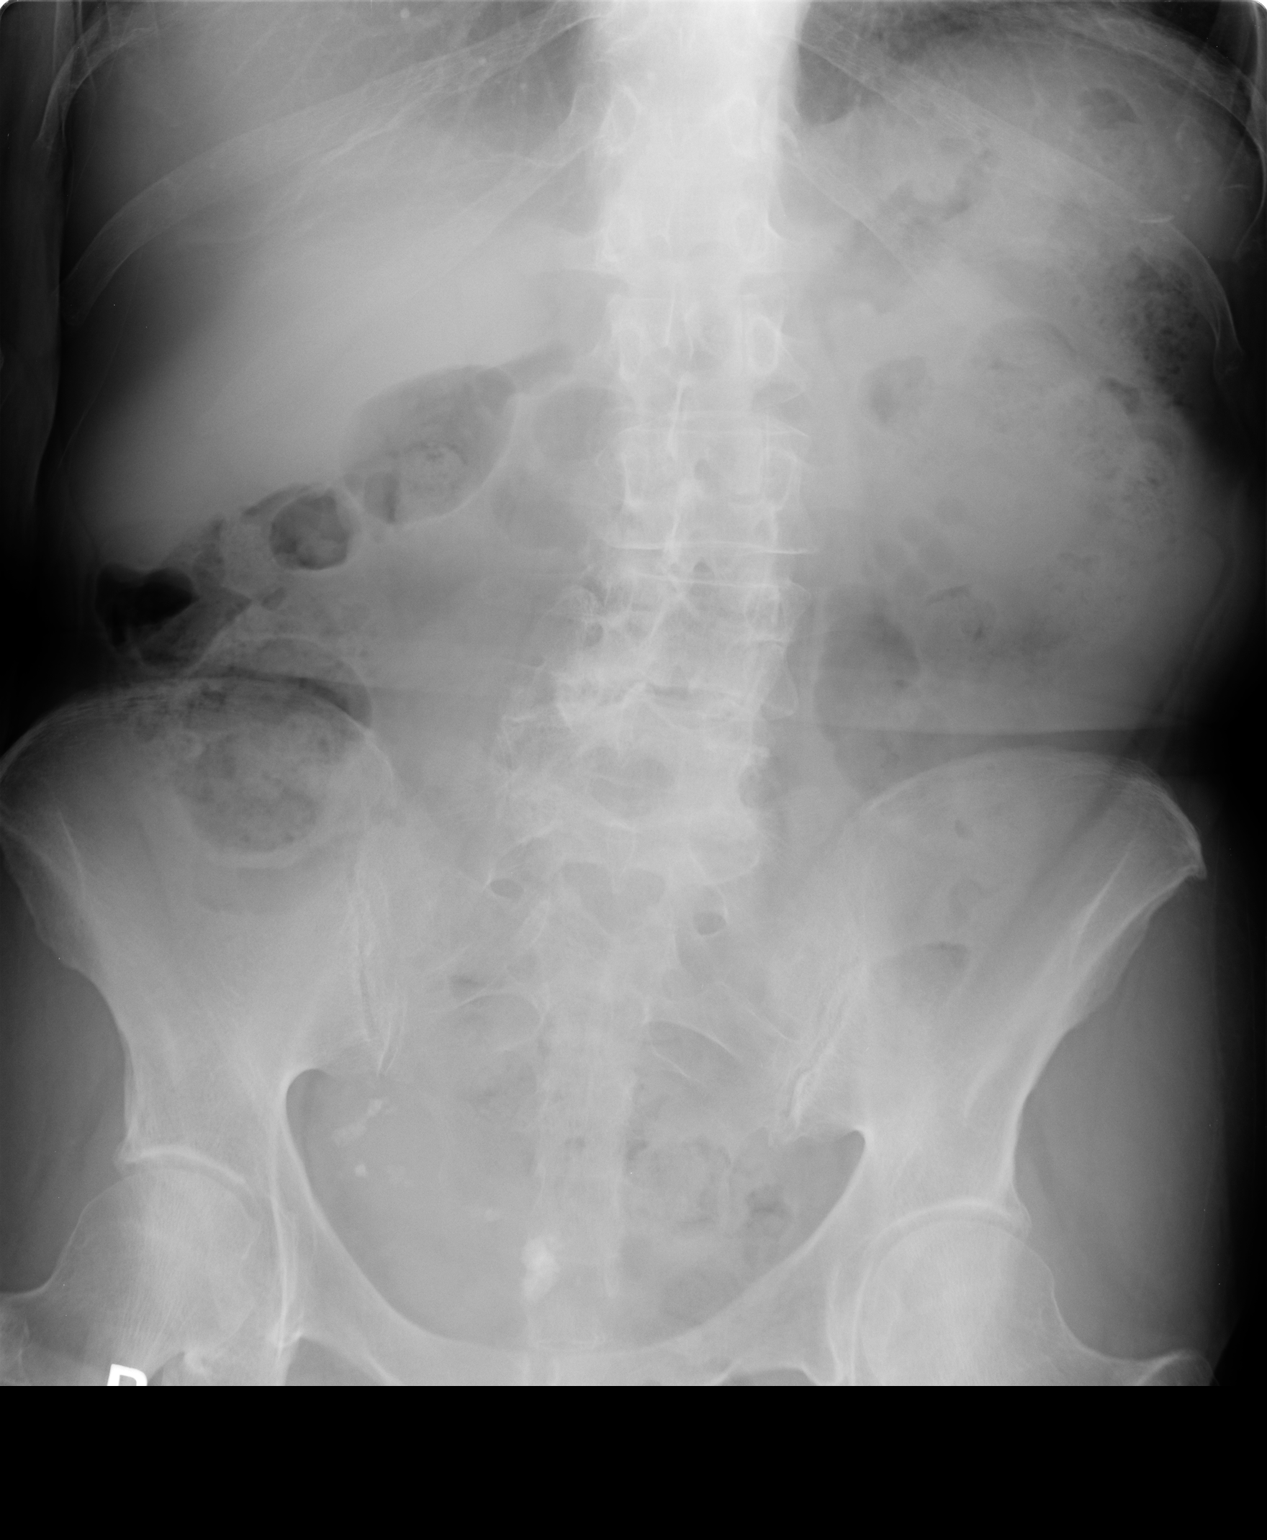

[1 of 1 positions shown; findings below may reference images not displayed]

FINDINGS: The bowel gas pattern is normal. Phleboliths are noted in the
pelvis. Mild levoscoliosis of lumbar spine is noted.
IMPRESSION: No evidence of bowel obstruction or ileus.

## 2017-07-04 DIAGNOSIS — M6281 Muscle weakness (generalized): Secondary | ICD-10-CM | POA: Diagnosis not present

## 2017-07-04 DIAGNOSIS — M79604 Pain in right leg: Secondary | ICD-10-CM | POA: Diagnosis not present

## 2017-07-04 DIAGNOSIS — M79605 Pain in left leg: Secondary | ICD-10-CM | POA: Diagnosis not present

## 2017-07-04 DIAGNOSIS — M545 Low back pain: Secondary | ICD-10-CM | POA: Diagnosis not present

## 2017-07-11 DIAGNOSIS — M545 Low back pain: Secondary | ICD-10-CM | POA: Diagnosis not present

## 2017-07-11 DIAGNOSIS — M79605 Pain in left leg: Secondary | ICD-10-CM | POA: Diagnosis not present

## 2017-07-11 DIAGNOSIS — M79604 Pain in right leg: Secondary | ICD-10-CM | POA: Diagnosis not present

## 2017-07-11 DIAGNOSIS — M6281 Muscle weakness (generalized): Secondary | ICD-10-CM | POA: Diagnosis not present

## 2017-07-16 DIAGNOSIS — H04123 Dry eye syndrome of bilateral lacrimal glands: Secondary | ICD-10-CM | POA: Diagnosis not present

## 2017-07-16 DIAGNOSIS — H5213 Myopia, bilateral: Secondary | ICD-10-CM | POA: Diagnosis not present

## 2017-07-16 DIAGNOSIS — H524 Presbyopia: Secondary | ICD-10-CM | POA: Diagnosis not present

## 2017-07-16 DIAGNOSIS — H4323 Crystalline deposits in vitreous body, bilateral: Secondary | ICD-10-CM | POA: Diagnosis not present

## 2017-07-17 DIAGNOSIS — M79604 Pain in right leg: Secondary | ICD-10-CM | POA: Diagnosis not present

## 2017-07-17 DIAGNOSIS — M79605 Pain in left leg: Secondary | ICD-10-CM | POA: Diagnosis not present

## 2017-07-17 DIAGNOSIS — M6281 Muscle weakness (generalized): Secondary | ICD-10-CM | POA: Diagnosis not present

## 2017-07-17 DIAGNOSIS — M545 Low back pain: Secondary | ICD-10-CM | POA: Diagnosis not present

## 2017-07-24 DIAGNOSIS — M545 Low back pain: Secondary | ICD-10-CM | POA: Diagnosis not present

## 2017-07-24 DIAGNOSIS — M6281 Muscle weakness (generalized): Secondary | ICD-10-CM | POA: Diagnosis not present

## 2017-07-24 DIAGNOSIS — M79605 Pain in left leg: Secondary | ICD-10-CM | POA: Diagnosis not present

## 2017-07-24 DIAGNOSIS — M79604 Pain in right leg: Secondary | ICD-10-CM | POA: Diagnosis not present

## 2017-08-08 DIAGNOSIS — M6281 Muscle weakness (generalized): Secondary | ICD-10-CM | POA: Diagnosis not present

## 2017-08-08 DIAGNOSIS — M79604 Pain in right leg: Secondary | ICD-10-CM | POA: Diagnosis not present

## 2017-08-08 DIAGNOSIS — M79605 Pain in left leg: Secondary | ICD-10-CM | POA: Diagnosis not present

## 2017-08-08 DIAGNOSIS — M545 Low back pain: Secondary | ICD-10-CM | POA: Diagnosis not present

## 2017-08-14 DIAGNOSIS — M6281 Muscle weakness (generalized): Secondary | ICD-10-CM | POA: Diagnosis not present

## 2017-08-14 DIAGNOSIS — M545 Low back pain: Secondary | ICD-10-CM | POA: Diagnosis not present

## 2017-08-14 DIAGNOSIS — M79604 Pain in right leg: Secondary | ICD-10-CM | POA: Diagnosis not present

## 2017-08-14 DIAGNOSIS — M79605 Pain in left leg: Secondary | ICD-10-CM | POA: Diagnosis not present

## 2017-08-29 DIAGNOSIS — M6281 Muscle weakness (generalized): Secondary | ICD-10-CM | POA: Diagnosis not present

## 2017-08-29 DIAGNOSIS — M79604 Pain in right leg: Secondary | ICD-10-CM | POA: Diagnosis not present

## 2017-08-29 DIAGNOSIS — M545 Low back pain: Secondary | ICD-10-CM | POA: Diagnosis not present

## 2017-08-29 DIAGNOSIS — M79605 Pain in left leg: Secondary | ICD-10-CM | POA: Diagnosis not present

## 2017-09-12 DIAGNOSIS — M545 Low back pain: Secondary | ICD-10-CM | POA: Diagnosis not present

## 2017-09-12 DIAGNOSIS — M79604 Pain in right leg: Secondary | ICD-10-CM | POA: Diagnosis not present

## 2017-09-12 DIAGNOSIS — M79605 Pain in left leg: Secondary | ICD-10-CM | POA: Diagnosis not present

## 2017-09-12 DIAGNOSIS — M6281 Muscle weakness (generalized): Secondary | ICD-10-CM | POA: Diagnosis not present

## 2017-09-24 DIAGNOSIS — M810 Age-related osteoporosis without current pathological fracture: Secondary | ICD-10-CM | POA: Diagnosis not present

## 2017-09-24 DIAGNOSIS — Z1231 Encounter for screening mammogram for malignant neoplasm of breast: Secondary | ICD-10-CM | POA: Diagnosis not present

## 2017-09-24 DIAGNOSIS — Z853 Personal history of malignant neoplasm of breast: Secondary | ICD-10-CM | POA: Diagnosis not present

## 2017-10-14 DIAGNOSIS — Z23 Encounter for immunization: Secondary | ICD-10-CM | POA: Diagnosis not present

## 2017-11-19 HISTORY — PX: ABDOMINAL HYSTERECTOMY: SHX81

## 2017-11-26 ENCOUNTER — Encounter: Payer: Self-pay | Admitting: Certified Nurse Midwife

## 2017-11-26 ENCOUNTER — Ambulatory Visit (INDEPENDENT_AMBULATORY_CARE_PROVIDER_SITE_OTHER): Payer: Medicare Other | Admitting: Certified Nurse Midwife

## 2017-11-26 ENCOUNTER — Other Ambulatory Visit: Payer: Self-pay

## 2017-11-26 VITALS — BP 120/80 | HR 70 | Resp 16 | Ht 61.75 in | Wt 135.0 lb

## 2017-11-26 DIAGNOSIS — Z780 Asymptomatic menopausal state: Secondary | ICD-10-CM

## 2017-11-26 DIAGNOSIS — N814 Uterovaginal prolapse, unspecified: Secondary | ICD-10-CM | POA: Diagnosis not present

## 2017-11-26 DIAGNOSIS — N816 Rectocele: Secondary | ICD-10-CM | POA: Diagnosis not present

## 2017-11-26 DIAGNOSIS — Z01419 Encounter for gynecological examination (general) (routine) without abnormal findings: Secondary | ICD-10-CM | POA: Diagnosis not present

## 2017-11-26 NOTE — Progress Notes (Signed)
82 y.o. G2P2 Married  Caucasian Fe here for annual exam. Menopausal no HRT. Denies vaginal bleeding or vaginal dryness. Patient feels the cystocele and rectocele have increased with loss of urine when need to urinate. Some occasional bowel movement struggles.. She is now working on establishing routine bowel habits with Miralax with some success. Exercises daily and walks frequently. Eating good diet. Sees Dr. Laurann Montana for aex, labs, cholesterol management and osteopenia. She is now off Evista due to no change in BMD. No other health concerns today.  Enjoyed lots of family during the holidays!  Patient's last menstrual period was 11/20/1987.          Sexually active: No.  The current method of family planning is vasectomy.    Exercising: Yes.    yoga, pilates, weights Smoker:  no  Health Maintenance: Pap:  09-06-14 neg, 11-22-16 neg History of Abnormal Pap: no MMG:  2018 pt to sign release Self Breast exams: no Colonoscopy:  2014 f/u 71yrs BMD:   2018 pt to sign release TDaP:  UTD Shingles: 2009 Pneumonia: 2001 Hep C and HIV: not done Labs: with PCP   reports that she quit smoking about 54 years ago. she has never used smokeless tobacco. She reports that she drinks about 4.2 oz of alcohol per week. She reports that she does not use drugs.  Past Medical History:  Diagnosis Date  . Anorgasmia of female   . Cancer ([place]) 1998   Left breast Lumpectomy, Chemo,Rad  . Cataract 2010 2011  . DDD (degenerative disc disease)   . GERD (gastroesophageal reflux disease) 2000  . Osteoporosis     Past Surgical History:  Procedure Laterality Date  . BREAST SURGERY  3/98   lumpectomy (left)  . CATARACT EXTRACTION Bilateral 2010 2011  . MOHS SURGERY  08/2009   nose, squamous cell ca  . SYNOVIAL CYST EXCISION     lumbar    Current Outpatient Medications  Medication Sig Dispense Refill  . CALCIUM PO Take by mouth daily.    . chlorpheniramine (CHLOR-TRIMETON) 4 MG tablet Take 4 mg by mouth at  bedtime.     . Ginger, Zingiber officinalis, (GINGER PO) Take by mouth. crystalized    . MAGNESIUM PO Take by mouth daily.    . Omega-3 Fatty Acids (FISH OIL PO) Take by mouth as needed.     . pantoprazole (PROTONIX) 40 MG tablet Take 40 mg by mouth daily.    . Probiotic Product (PROBIOTIC PO) Take by mouth daily.    . TURMERIC PO Take by mouth daily.    Marland Kitchen UNABLE TO FIND daily. rx nasal spray     No current facility-administered medications for this visit.     Family History  Problem Relation Age of Onset  . Osteoporosis Mother   . Heart disease Father     ROS:  Pertinent items are noted in HPI.  Otherwise, a comprehensive ROS was negative.  Exam:   LMP 11/20/1987    Ht Readings from Last 3 Encounters:  11/22/16 5' 2.25" (1.581 m)  11/03/15 5' 2.25" (1.581 m)  09/06/14 5' 2.25" (1.581 m)    General appearance: alert, cooperative and appears stated age Head: Normocephalic, without obvious abnormality, atraumatic Neck: no adenopathy, supple, symmetrical, trachea midline and thyroid normal to inspection and palpation Lungs: clear to auscultation bilaterally Breasts: normal appearance, no masses or tenderness, No nipple retraction or dimpling, No nipple discharge or bleeding, No axillary or supraclavicular adenopathy Heart: regular rate and rhythm Abdomen: soft,  non-tender; no masses,  no organomegaly Extremities: extremities normal, atraumatic, no cyanosis or edema Skin: Skin color, texture, turgor normal. No rashes or lesions Lymph nodes: Cervical, supraclavicular, and axillary nodes normal. No abnormal inguinal nodes palpated Neurologic: Grossly normal   Pelvic: External genitalia:  no lesions              Urethra:  normal appearing urethra with no masses, tenderness or lesions              Bartholin's and Skene's: normal                 Vagina: normal appearing vagina with normal color and discharge, no lesions, with cystocele/uterine prolapse noted at introitus,  rectocele noted also grade 1              Cervix: multiparous appearance, no cervical motion tenderness and no lesions              Pap taken: No. Bimanual Exam:  Uterus:  prolapsed third degree and normal size, non tender              Adnexa: normal adnexa and no mass, fullness, tenderness               Rectovaginal: Confirms               Anus:  normal sphincter tone, no lesions  Chaperone present: yes  A:  Well Woman with normal exam  Post Menopausal no HRT  Cystocele with complete uterine prolapse, rectocele noted (known)  Urinary incontinence at times  Cholesterol management with PCP  Osteopenia no change with BMD, stopped Evista per PCP  P:   Reviewed health and wellness pertinent to exam  Aware of need to evaluate if vaginal bleeding  Discussed cystocele/rectocele/prolapse of uterus finding again and options of pessary trial again or consult for repair. Discussed without hysterectomy the repair will not provide help with bladder. Patient ready to move forward now. Patient will schedule with Dr. Sabra Heck prior to leaving for evaluation. Patient aware that for bladder repair [facility] is also used. Questions addressed.  Continue follow up with PCP as indicated.  Encouraged to continue exercise and bone support  Pap smear: no   counseled on breast self exam, mammography screening, feminine hygiene, adequate intake of calcium and vitamin D, diet and exercise  return annually or prn  An After Visit Summary was printed and given to the patient.

## 2017-11-26 NOTE — Patient Instructions (Signed)
EXERCISE AND DIET:  We recommended that you start or continue a regular exercise program for good health. Regular exercise means any activity that makes your heart beat faster and makes you sweat.  We recommend exercising at least 30 minutes per day at least 3 days a week, preferably 4 or 5.  We also recommend a diet low in fat and sugar.  Inactivity, poor dietary choices and obesity can cause diabetes, heart attack, stroke, and kidney damage, among others.    ALCOHOL AND SMOKING:  Women should limit their alcohol intake to no more than 7 drinks/beers/glasses of wine (combined, not each!) per week. Moderation of alcohol intake to this level decreases your risk of breast cancer and liver damage. And of course, no recreational drugs are part of a healthy lifestyle.  And absolutely no smoking or even second hand smoke. Most people know smoking can cause heart and lung diseases, but did you know it also contributes to weakening of your bones? Aging of your skin?  Yellowing of your teeth and nails?  CALCIUM AND VITAMIN D:  Adequate intake of calcium and Vitamin D are recommended.  The recommendations for exact amounts of these supplements seem to change often, but generally speaking 600 mg of calcium (either carbonate or citrate) and 800 units of Vitamin D per day seems prudent. Certain women may benefit from higher intake of Vitamin D.  If you are among these women, your doctor will have told you during your visit.    PAP SMEARS:  Pap smears, to check for cervical cancer or precancers,  have traditionally been done yearly, although recent scientific advances have shown that most women can have pap smears less often.  However, every woman still should have a physical exam from her gynecologist every year. It will include a breast check, inspection of the vulva and vagina to check for abnormal growths or skin changes, a visual exam of the cervix, and then an exam to evaluate the size and shape of the uterus and  ovaries.  And after 80 years of age, a rectal exam is indicated to check for rectal cancers. We will also provide age appropriate advice regarding health maintenance, like when you should have certain vaccines, screening for sexually transmitted diseases, bone density testing, colonoscopy, mammograms, etc.   MAMMOGRAMS:  All women over 40 years old should have a yearly mammogram. Many facilities now offer a "3D" mammogram, which may cost around $50 extra out of pocket. If possible,  we recommend you accept the option to have the 3D mammogram performed.  It both reduces the number of women who will be called back for extra views which then turn out to be normal, and it is better than the routine mammogram at detecting truly abnormal areas.    COLONOSCOPY:  Colonoscopy to screen for colon cancer is recommended for all women at age 50.  We know, you hate the idea of the prep.  We agree, BUT, having colon cancer and not knowing it is worse!!  Colon cancer so often starts as a polyp that can be seen and removed at colonscopy, which can quite literally save your life!  And if your first colonoscopy is normal and you have no family history of colon cancer, most women don't have to have it again for 10 years.  Once every ten years, you can do something that may end up saving your life, right?  We will be happy to help you get it scheduled when you are ready.    Be sure to check your insurance coverage so you understand how much it will cost.  It may be covered as a preventative service at no cost, but you should check your particular policy.   ? ?About Rectocele ? ?Overview ? ?A rectocele is a type of hernia which causes different degrees of bulging of the rectal tissues into the vaginal wall.  You may even notice that it presses against the vaginal wall so much that some vaginal tissues droop outside of the opening of your vagina. ? ?Causes of Rectocele ? ?The most common cause is childbirth.  The muscles and ligaments  in the pelvis that hold up and support the female organs and vagina become stretched and weakened during labor and delivery.  The more babies you have, the more the support tissues are stretched and weakened.  Not everyone who has a baby will develop a rectocele.  Some women have stronger supporting tissue in the pelvis and may not have as much of a problem as others.  Women who have a Cesarean section usually do not get rectocele's unless they pushed a long time prior to the cesarean delivery. ? ?Other conditions that can cause a rectocele include chronic constipation, a chronic cough, a lot of heavy lifting, and obesity.  Older women may have this problem because the loss of female hormones causes the vaginal tissue to become weaker. ? ?Symptoms ? ?There may not be any symptoms.  If you do have symptoms, they may include: ?Pelvic pressure in the rectal area ?Protrusion of the lower part of the vagina through the opening of the vagina ?Constipation and trapping of the stool, making it difficult to have a bowel movement.  In severe cases, you may have to press on the lower part of your vagina to help push the stool out of you rectum.  This is called splinting to empty. ? ?Diagnosing Rectocele ? ?Your health care provider will ask about your symptoms and perform a pelvic exam.  S/he will ask you to bear down, pushing like you are having a bowel movement so as to see how far the lower part of the vagina protrudes into the vagina and possible outside of the vagina.  Your provider will also ask you to contract the muscles of your pelvis (like you are stopping the stream in the middle of urinating) to determine the strength of your pelvic muscles.  Your provider may also do a rectal exam. ? ?Treatment Options ? ?If you do not have any symptoms, no treatment may be necessary.  Other treatment options include: ?Pelvic floor exercises: Contracting the muscles in your genital area may help strengthen your muscles and support  the organs.  Be sure to get proper exercise instruction from you physical therapist. ?A pessary (removealbe pelvic support device) sometimes helps rectocele symptoms. ?Surgery: Surgical repair may be necessary. In some cases the uterus may need to be taken out ( a hysterectomy) as well.  There are many types of surgery for pelvic support problems.  Look for physicians who specialize in repair procedures. ? ?You can take care of yourself by: ?Treating and preventing constipation ?Avoiding heavy lifting, and lifting correctly (with your legs, not with you waist or back) ?Treating a chronic cough or bronchitis ?Not smoking ?avoiding too much weight gain ?Doing pelvic floor exercises ? ?? 2007, [employer] Doc.33 ?

## 2017-11-27 ENCOUNTER — Encounter: Payer: Self-pay | Admitting: Certified Nurse Midwife

## 2017-11-27 DIAGNOSIS — L718 Other rosacea: Secondary | ICD-10-CM | POA: Diagnosis not present

## 2017-11-27 DIAGNOSIS — Z85828 Personal history of other malignant neoplasm of skin: Secondary | ICD-10-CM | POA: Diagnosis not present

## 2017-11-27 DIAGNOSIS — L570 Actinic keratosis: Secondary | ICD-10-CM | POA: Diagnosis not present

## 2017-11-27 DIAGNOSIS — L821 Other seborrheic keratosis: Secondary | ICD-10-CM | POA: Diagnosis not present

## 2017-11-27 DIAGNOSIS — D2271 Melanocytic nevi of right lower limb, including hip: Secondary | ICD-10-CM | POA: Diagnosis not present

## 2017-11-27 DIAGNOSIS — H04123 Dry eye syndrome of bilateral lacrimal glands: Secondary | ICD-10-CM | POA: Diagnosis not present

## 2017-11-27 DIAGNOSIS — D2261 Melanocytic nevi of right upper limb, including shoulder: Secondary | ICD-10-CM | POA: Diagnosis not present

## 2017-11-28 ENCOUNTER — Ambulatory Visit (INDEPENDENT_AMBULATORY_CARE_PROVIDER_SITE_OTHER): Payer: Medicare Other | Admitting: Obstetrics & Gynecology

## 2017-11-28 ENCOUNTER — Encounter: Payer: Self-pay | Admitting: Obstetrics & Gynecology

## 2017-11-28 ENCOUNTER — Other Ambulatory Visit: Payer: Self-pay

## 2017-11-28 VITALS — BP 126/82 | HR 86 | Resp 16 | Ht 61.75 in | Wt 137.2 lb

## 2017-11-28 DIAGNOSIS — R3915 Urgency of urination: Secondary | ICD-10-CM

## 2017-11-28 DIAGNOSIS — R151 Fecal smearing: Secondary | ICD-10-CM | POA: Diagnosis not present

## 2017-11-28 DIAGNOSIS — N814 Uterovaginal prolapse, unspecified: Secondary | ICD-10-CM | POA: Diagnosis not present

## 2017-12-01 NOTE — Progress Notes (Addendum)
GYNECOLOGY  VISIT  CC:   prolapse  HPI: 82 y.o. G2P2 Married Caucasian female here for discussion of treatment options for prolapse.  Has been experiencing mild prolase symptoms for years but has worsened in the past year.  Now feels the prolapse more often and when needs to void, she just go immediately or will leak some.  Wearing pads all of the time if out of the house.  Also has noted a little fecal incontinence.  Feels, at times, like she needs to have BM and then can't until all of a sudden when she needs to to to the bathroom.  Will have already soiled a little.    Saw French Ana, CNM, on 11/26/17, and surgery was discussed.  Pt is really not interested in proceeding with surgery.  Although she feels she is healthy for aged 33, she does not want any lengthy procedure that is not necessary.  Denies vaginal discharge and vaginal bleeding.  Denies pelvic pain.  GYNECOLOGIC HISTORY: Patient's last menstrual period was 11/20/1987. Contraception: PMP Menopausal hormone therapy: none  Patient Active Problem List   Diagnosis Date Noted  . CHRONIC RHINITIS 12/13/2010  . COUGH 07/13/2009  . BREAST CANCER 07/12/2009  . PURE HYPERCHOLESTEROLEMIA 07/12/2009  . MERALGIA PARESTHETICA 07/12/2009  . MACULAR DEGENERATION 07/12/2009  . GERD 07/12/2009  . OSTEOARTHRITIS 07/12/2009  . BACK PAIN, LUMBAR 07/12/2009  . OSTEOPENIA 07/12/2009  . URINARY INCONTINENCE 07/12/2009  . BREAST BIOPSY, BY INCISION, HX OF 07/12/2009    Past Medical History:  Diagnosis Date  . Anorgasmia of female   . Cancer ([place]) 1998   Left breast Lumpectomy, Chemo,Rad  . Cataract 2010 2011  . DDD (degenerative disc disease)   . GERD (gastroesophageal reflux disease) 2000  . Osteoporosis     Past Surgical History:  Procedure Laterality Date  . BREAST SURGERY  3/98   lumpectomy (left)  . CATARACT EXTRACTION Bilateral 2010 2011  . MOHS SURGERY  08/2009   nose, squamous cell ca  . SYNOVIAL CYST EXCISION     lumbar    MEDS:   Current Outpatient Medications on File Prior to Visit  Medication Sig Dispense Refill  . CALCIUM PO Take by mouth daily.    . chlorpheniramine (CHLOR-TRIMETON) 4 MG tablet Take 4 mg by mouth at bedtime.     . Ginger, Zingiber officinalis, (GINGER PO) Take by mouth. crystalized    . MAGNESIUM PO Take by mouth daily.    . Multiple Vitamins-Minerals (MULTIVITAMIN PO) Take by mouth.    . Omega-3 Fatty Acids (FISH OIL PO) Take by mouth as needed.     . Probiotic Product (PROBIOTIC PO) Take by mouth daily.    . TURMERIC PO Take by mouth daily.    Marland Kitchen UNABLE TO FIND Ipratropium nasal spray    . UNABLE TO FIND Wellness formula (herbal)     No current facility-administered medications on file prior to visit.     ALLERGIES: Patient has no known allergies.  Family History  Problem Relation Age of Onset  . Osteoporosis Mother   . Heart disease Father     SH:  Married, non smoker  Review of Systems  Constitutional: Negative.   Respiratory: Negative.   Cardiovascular: Negative.   Gastrointestinal: Negative for abdominal pain, constipation and diarrhea.  Genitourinary: Positive for urgency (when needs to void). Negative for dysuria, frequency and hematuria.    PHYSICAL EXAMINATION:    BP 126/82 (BP Location: Right Arm, Patient Position: Sitting, Cuff Size: Normal)  Pulse 86   Resp 16   Ht 5' 1.75" (1.568 m)   Wt 137 lb 4 oz (62.3 kg)   LMP 11/20/1987   BMI 25.31 kg/m     General appearance: alert, cooperative and appears stated age Abdomen: soft, non-tender; bowel sounds normal; no masses,  no organomegaly  Pelvic: External genitalia:  no lesions              Urethra:  normal appearing urethra with no masses, tenderness or lesions              Bartholins and Skenes: normal                 Vagina: normal appearing vagina with normal color and discharge, no lesions, 3rd degree cystocele with incomplete uterine prolapse              Cervix: no lesions               Bimanual Exam:  Uterus:  normal size, contour, position, consistency, mobility, non-tender              Adnexa: no mass, fullness, tenderness              Rectovaginal: confirms              Anus:  Mildly decreased sphincter tone, no lesions  Options for treatment discussed with pt.  Pelvic PT, pessary use, and surgical correction with hysterectomy, cystocele repair and possible vault support procedure.  Length of procedure and recover discussed.  Pt continues to not be interested in surgery.  Would like pessary fitting.  Fitting performed while pt was in office.  #5 incontinence ring with support placed.  Too large.  Removed.  #4 incontinence ring with support placed.  Good fit noted.  Able to ambulate and void with this.  Fitting pessary removed and personal pessary replaced.    Chaperone was present for exam.  Assessment: 3rd degree cystocele Incomplete uterine prolapse Mild fecal incontinence  Plan: Pessary fitting today.  Declines surgery and PT at this time. Metamucil daily recommended to help with stool bulking and hopefully decreased fecal incontinence.  May need to readdress pelvic PT at some point.   ~30 minutes spent with patient >50% of time was in face to face discussion of above.

## 2017-12-18 ENCOUNTER — Telehealth: Payer: Self-pay | Admitting: Obstetrics & Gynecology

## 2017-12-18 NOTE — Telephone Encounter (Signed)
Spoke with patient. Patient had a # 4 incontinence ring with support placed on 11/28/2017. States she feels this is a good fit and is holding everything in place. Able to use the restroom without problems and is not having any pain or discomfort. States that she feels the ring may be trying to move into a perpendicular position. Recommended the patient remove and replace pessary to get it in the correct position to avoid problems with incurrent positioning. Patient feels comfortable with removing and placing pessary. Reports if she has any problems she will contact the office to schedule an appointment for Dr.Miller to readjust the pessary.  Routing to provider for final review. Patient agreeable to disposition. Will close encounter.

## 2017-12-18 NOTE — Telephone Encounter (Signed)
Patient called with questions for the nurse about her pessary. She said she was told to call and give a report.

## 2017-12-26 DIAGNOSIS — J0100 Acute maxillary sinusitis, unspecified: Secondary | ICD-10-CM | POA: Diagnosis not present

## 2017-12-26 DIAGNOSIS — J9801 Acute bronchospasm: Secondary | ICD-10-CM | POA: Diagnosis not present

## 2018-03-20 DIAGNOSIS — M545 Low back pain: Secondary | ICD-10-CM | POA: Diagnosis not present

## 2018-03-20 DIAGNOSIS — K219 Gastro-esophageal reflux disease without esophagitis: Secondary | ICD-10-CM | POA: Diagnosis not present

## 2018-03-20 DIAGNOSIS — N816 Rectocele: Secondary | ICD-10-CM | POA: Diagnosis not present

## 2018-03-20 DIAGNOSIS — M85851 Other specified disorders of bone density and structure, right thigh: Secondary | ICD-10-CM | POA: Diagnosis not present

## 2018-03-20 DIAGNOSIS — Z0000 Encounter for general adult medical examination without abnormal findings: Secondary | ICD-10-CM | POA: Diagnosis not present

## 2018-03-20 DIAGNOSIS — Z86010 Personal history of colonic polyps: Secondary | ICD-10-CM | POA: Diagnosis not present

## 2018-03-20 DIAGNOSIS — N814 Uterovaginal prolapse, unspecified: Secondary | ICD-10-CM | POA: Diagnosis not present

## 2018-03-20 DIAGNOSIS — N8110 Cystocele, unspecified: Secondary | ICD-10-CM | POA: Diagnosis not present

## 2018-03-20 DIAGNOSIS — Z1389 Encounter for screening for other disorder: Secondary | ICD-10-CM | POA: Diagnosis not present

## 2018-03-20 DIAGNOSIS — M85852 Other specified disorders of bone density and structure, left thigh: Secondary | ICD-10-CM | POA: Diagnosis not present

## 2018-04-03 DIAGNOSIS — H04123 Dry eye syndrome of bilateral lacrimal glands: Secondary | ICD-10-CM | POA: Diagnosis not present

## 2018-04-03 DIAGNOSIS — H16122 Filamentary keratitis, left eye: Secondary | ICD-10-CM | POA: Diagnosis not present

## 2018-04-08 ENCOUNTER — Telehealth: Payer: Self-pay | Admitting: Obstetrics & Gynecology

## 2018-04-08 NOTE — Telephone Encounter (Signed)
Patient would like to speak with a nurse regarding her pessary. States that when it is in, she leaks urine worse. It also falls out. May need to be seen, but wants to talk to a nurse first.

## 2018-04-08 NOTE — Telephone Encounter (Signed)
Left message to call Adreona Brand at [phone]. 

## 2018-04-08 NOTE — Telephone Encounter (Signed)
Patient returned call

## 2018-04-08 NOTE — Telephone Encounter (Signed)
Spoke with patient. Patient states that when she has her pessary in place she is having urinary leakage. Also having trouble keeping it in place. Feels it is constantly moving and falls out. Denies any trouble emptying bladder. Advised will need to be seen for further evaluation. May need a different size pessary. Patient is agreeable. Appointment scheduled for 04/10/18 at 3 pm with Dr.Miller. Agreeable to date and time.  Routing to provider for final review. Patient agreeable to disposition. Will close encounter.

## 2018-04-10 ENCOUNTER — Encounter: Payer: Self-pay | Admitting: Obstetrics & Gynecology

## 2018-04-10 ENCOUNTER — Ambulatory Visit (INDEPENDENT_AMBULATORY_CARE_PROVIDER_SITE_OTHER): Payer: Medicare Other | Admitting: Obstetrics & Gynecology

## 2018-04-10 ENCOUNTER — Other Ambulatory Visit: Payer: Self-pay

## 2018-04-10 VITALS — BP 120/68 | HR 92 | Resp 16 | Ht 61.75 in | Wt 135.0 lb

## 2018-04-10 DIAGNOSIS — N814 Uterovaginal prolapse, unspecified: Secondary | ICD-10-CM

## 2018-04-10 NOTE — Progress Notes (Signed)
GYNECOLOGY  VISIT  CC:   Pessary issues  HPI: 82 y.o. G2P2 Married Caucasian female here for pessary discussion.  Had a #4 incontinence ring with support placed.  Did fine for a few weeks but then with coughing, her bladder began to come around the pessary.  No matter what she tried, she couldn't get the pessary back in the right place and then it was turned 90 degrees.  This was uncomfortable and she was able to removed the pessary.  Since that time, she has not been able to get the pessary to go back into the vagina.  Reports she's tried several times.  Denies vaginal bleeding.  Really doesn't want surgery but asks if there is a way to just "sew me shut" and we discussed surgical treatment for prolapse involving this type of surgery.  Interested in this if she cannot find a pessary that works well.  GYNECOLOGIC HISTORY: Patient's last menstrual period was 11/20/1987. Contraception: post menopausal  Menopausal hormone therapy: none   Patient Active Problem List   Diagnosis Date Noted  . CHRONIC RHINITIS 12/13/2010  . COUGH 07/13/2009  . BREAST CANCER 07/12/2009  . PURE HYPERCHOLESTEROLEMIA 07/12/2009  . MERALGIA PARESTHETICA 07/12/2009  . MACULAR DEGENERATION 07/12/2009  . GERD 07/12/2009  . OSTEOARTHRITIS 07/12/2009  . BACK PAIN, LUMBAR 07/12/2009  . OSTEOPENIA 07/12/2009  . URINARY INCONTINENCE 07/12/2009  . BREAST BIOPSY, BY INCISION, HX OF 07/12/2009    Past Medical History:  Diagnosis Date  . Anorgasmia of female   . Cancer ([place]) 1998   Left breast Lumpectomy, Chemo,Rad  . Cataract 2010 2011  . DDD (degenerative disc disease)   . GERD (gastroesophageal reflux disease) 2000  . Osteoporosis     Past Surgical History:  Procedure Laterality Date  . BREAST SURGERY  3/98   lumpectomy (left)  . CATARACT EXTRACTION Bilateral 2010 2011  . MOHS SURGERY  08/2009   nose, squamous cell ca  . SYNOVIAL CYST EXCISION     lumbar    MEDS:   Current Outpatient Medications on  File Prior to Visit  Medication Sig Dispense Refill  . CALCIUM PO Take by mouth daily.    . chlorpheniramine (CHLOR-TRIMETON) 4 MG tablet Take 4 mg by mouth at bedtime.     . Ginger, Zingiber officinalis, (GINGER PO) Take by mouth. crystalized    . MAGNESIUM PO Take by mouth daily.    . Multiple Vitamins-Minerals (MULTIVITAMIN PO) Take by mouth.    . Omega-3 Fatty Acids (FISH OIL PO) Take by mouth as needed.     . Probiotic Product (PROBIOTIC PO) Take by mouth daily.    . TURMERIC PO Take by mouth daily.    Marland Kitchen UNABLE TO FIND Ipratropium nasal spray    . UNABLE TO FIND Wellness formula (herbal)     No current facility-administered medications on file prior to visit.     ALLERGIES: Patient has no known allergies.  Family History  Problem Relation Age of Onset  . Osteoporosis Mother   . Heart disease Father     SH:  Married, non smoker  Review of Systems  Genitourinary:       Night urination   All other systems reviewed and are negative.   PHYSICAL EXAMINATION:    BP 120/68 (BP Location: Right Arm, Patient Position: Sitting, Cuff Size: Normal)   Pulse 92   Resp 16   Ht 5' 1.75" (1.568 m)   Wt 135 lb (61.2 kg)   LMP 11/20/1987  BMI 24.89 kg/m     General appearance: alert, cooperative and appears stated age Lymph: no inguinal LAD  Pelvic: External genitalia:  no lesions              Urethra:  normal appearing urethra with no masses, tenderness or lesions, 4th degree cystocele present              Bartholins and Skenes: normal                 Vagina: normal appearing vagina with normal color and discharge, no lesions              Cervix: no lesions              Bimanual Exam:  Uterus:  normal size, contour, position, consistency, mobility, non-tender              Adnexa: no mass, fullness, tenderness              Rectovaginal: Yes.  .  Confirms.  Placement with #5 incontinence ring with support (Milex type) placed.  Pt tolerated placement well.  She was able to  ambulate and void with this pessary.  She could feel the pessary but it was not uncomfortable.    Chaperone was present for exam.  Assessment: 4th degree cystocele  Plan: Placement of new pessary today.  Pt will see how she feels over the next several days.  If no issues, plan to follow up in 3-4 months.  If pessary comes out, will order stiffer #4 incontinence ring.  If this does not work, will consider colpocleisis.     ~25 minutes spent with patient >50% of time was in face to face discussion of above.

## 2018-04-17 ENCOUNTER — Telehealth: Payer: Self-pay | Admitting: Obstetrics & Gynecology

## 2018-04-17 NOTE — Telephone Encounter (Signed)
Can you let pt know we will order another one and let her know when it comes in for her to come in for placement.  Gay Filler, I need one of the stiffer pessaries for this pt--#4 incontinence ring with support.    Thank you.

## 2018-04-17 NOTE — Telephone Encounter (Signed)
Patient is calling to talk with Dr.Miller's nurse regarding her pessary.

## 2018-04-17 NOTE — Telephone Encounter (Signed)
Spoke with patient. Patient states that she has had her new pessary in for 7 days and It is not staying in place. Keeps turning horizontally and protruding from vagina. #5 incontinence ring with support was placed 5/23. Patient states she was told to call with an update and new pessary would be ordered if this was not working well. Advised will review with Dr.MIller and return call.   Dr.Miller per note need to order stiffer #4 incontinence ring. Okay to proceed?  CC Lamont Snowball, RN

## 2018-04-18 NOTE — Telephone Encounter (Signed)
Spoke with patient. Advised of message as seen below from Dr.Miller. Patient verbalizes understanding. Encounter closed. 

## 2018-04-21 NOTE — Telephone Encounter (Signed)
Pessary ordered as directed.

## 2018-04-24 ENCOUNTER — Telehealth: Payer: Self-pay | Admitting: *Deleted

## 2018-04-24 NOTE — Telephone Encounter (Signed)
Left message to call Sharee Pimple at [phone].   DME arrived in office. Patient can schedule OV with Dr. Sabra Heck for insertion or may pick up from office.   Milex Cooper Surgical Ring w/support folding pessary. #4, 2-3/4" (63mm) REF: [id number]  LOT: [id number] Exp: 2024 -05 -31

## 2018-04-29 DIAGNOSIS — N814 Uterovaginal prolapse, unspecified: Secondary | ICD-10-CM | POA: Diagnosis not present

## 2018-04-29 NOTE — Telephone Encounter (Signed)
Spoke with patient. Patient would like to pick up pessary from office. States that she feels comfortable placing the pessary. If she is unable she will contact the office to schedule an OV.  Cc Jessica Mcdonald regarding pessary charge  Routing to provider for final review. Patient agreeable to disposition. Will close encounter.

## 2018-05-01 ENCOUNTER — Telehealth: Payer: Self-pay | Admitting: Obstetrics & Gynecology

## 2018-05-01 NOTE — Telephone Encounter (Signed)
Call to patient. Clarified that pessary is the same size but different brand.  This is [employer] brand that usually provides a more firm, stiff fitting.  Patient reports it is not working and she is leaking large amount. She had to remove it. States she is willing to try again if recommended.  Advised since she is having issues with this both brands, recommend bring both and come for office visit. Appointment scheduled for 05-02-18 at 1245.   Routing to provider for final review. Will close encounter.

## 2018-05-01 NOTE — Telephone Encounter (Signed)
Patient left message questioning if she picked up the correct pessary because she believes the new one feels just like the old one.

## 2018-05-02 ENCOUNTER — Other Ambulatory Visit: Payer: Self-pay

## 2018-05-02 ENCOUNTER — Encounter: Payer: Self-pay | Admitting: Obstetrics & Gynecology

## 2018-05-02 ENCOUNTER — Ambulatory Visit (INDEPENDENT_AMBULATORY_CARE_PROVIDER_SITE_OTHER): Payer: Medicare Other | Admitting: Obstetrics & Gynecology

## 2018-05-02 VITALS — BP 132/74 | HR 80 | Resp 14 | Ht 61.75 in | Wt 135.0 lb

## 2018-05-02 DIAGNOSIS — N814 Uterovaginal prolapse, unspecified: Secondary | ICD-10-CM | POA: Diagnosis not present

## 2018-05-02 NOTE — Progress Notes (Signed)
GYNECOLOGY  VISIT  CC:   Issues with pessary  HPI: 82 y.o. G2P2 Married Caucasian female here for discussion of options to deal with cystocele.  Pt was seen in January.  #4 incontinence ring with support was placed.  She did well with this for several months until she had an episode of coughing and pessary seemed to move.  She never was able to get it back in and be comfortable with it.  She ultimately removed it and then had difficulty was placement of the pessary.  #5 incontinence ring with support was then placed.  She felt this one kept moving so more stiff, Milex, #4 incontinence ring was ordered.  She came in and picked this up but still has experienced issues with the pessary moving.  She is back again to see if there are any other options.  We have discussed colpocleisis but she is not completely sure she wants to proceed with this.  She does not want a repair of the prolapse due to the length of surgery and the need for general anesthesia.  She is aware the colpocleisis could be done with sedation and local anesthesia or with and epidural.    Denies vaginal bleeding or discharge.   GYNECOLOGIC HISTORY: Patient's last menstrual period was 11/20/1987. Contraception: post menopausal  Menopausal hormone therapy: none  Patient Active Problem List   Diagnosis Date Noted  . CHRONIC RHINITIS 12/13/2010  . COUGH 07/13/2009  . BREAST CANCER 07/12/2009  . PURE HYPERCHOLESTEROLEMIA 07/12/2009  . MERALGIA PARESTHETICA 07/12/2009  . MACULAR DEGENERATION 07/12/2009  . GERD 07/12/2009  . OSTEOARTHRITIS 07/12/2009  . BACK PAIN, LUMBAR 07/12/2009  . OSTEOPENIA 07/12/2009  . URINARY INCONTINENCE 07/12/2009  . BREAST BIOPSY, BY INCISION, HX OF 07/12/2009    Past Medical History:  Diagnosis Date  . Anorgasmia of female   . Cancer ([place]) 1998   Left breast Lumpectomy, Chemo,Rad  . Cataract 2010 2011  . DDD (degenerative disc disease)   . GERD (gastroesophageal reflux disease) 2000  .  Osteoporosis     Past Surgical History:  Procedure Laterality Date  . BREAST SURGERY  3/98   lumpectomy (left)  . CATARACT EXTRACTION Bilateral 2010 2011  . MOHS SURGERY  08/2009   nose, squamous cell ca  . SYNOVIAL CYST EXCISION     lumbar    MEDS:   Current Outpatient Medications on File Prior to Visit  Medication Sig Dispense Refill  . CALCIUM PO Take by mouth daily.    . chlorpheniramine (CHLOR-TRIMETON) 4 MG tablet Take 4 mg by mouth at bedtime.     . Ginger, Zingiber officinalis, (GINGER PO) Take by mouth. crystalized    . MAGNESIUM PO Take by mouth daily.    . Multiple Vitamins-Minerals (MULTIVITAMIN PO) Take by mouth.    . Omega-3 Fatty Acids (FISH OIL PO) Take by mouth as needed.     . Probiotic Product (PROBIOTIC PO) Take by mouth daily.    . TURMERIC PO Take by mouth daily.    Marland Kitchen UNABLE TO FIND Ipratropium nasal spray    . UNABLE TO FIND Wellness formula (herbal)     No current facility-administered medications on file prior to visit.     ALLERGIES: Patient has no known allergies.  Family History  Problem Relation Age of Onset  . Osteoporosis Mother   . Heart disease Father     SH:  Married, non smoker  Review of Systems  All other systems reviewed and are negative.  PHYSICAL EXAMINATION:    BP 132/74 (BP Location: Right Arm, Patient Position: Sitting, Cuff Size: Normal)   Pulse 80   Resp 14   Ht 5' 1.75" (1.568 m)   Wt 135 lb (61.2 kg)   LMP 11/20/1987   BMI 24.89 kg/m     General appearance: alert, cooperative and appears stated age Abdomen: soft, non-tender; bowel sounds normal; no masses,  no organomegaly  Pelvic: External genitalia:  no lesions              Urethra:  normal appearing urethra with no masses, tenderness or lesions              Bartholins and Skenes: normal                 Vagina: normal appearing vagina with normal color and discharge, no lesions, 4th degree cystocele present              Cervix: no lesions               Bimanual Exam:  Uterus:  normal size, contour, position, consistency, mobility, non-tender              Adnexa: no mass, fullness, tenderness  Placement of #5 incontinence ring with support (Premier type) pessary placed.  Although this does completely fill vagina and is located just behind the urethra, she is comfortable with this pessary and does feel she will tolerate it.    Advised pt to please run some errands in the area and if she has any issues with the pessary, please return so I can remove the pessary.  Pt voices understanding.  Chaperone was present for exam.  Assessment: 4th degree cystocele  Plan: Advised to call with any issues/concerns.  A larger pessary is not going to work for her so I don't know if will have any other options for pt from pessary standpoint if this one does not work for her.

## 2018-05-23 DIAGNOSIS — R300 Dysuria: Secondary | ICD-10-CM | POA: Diagnosis not present

## 2018-09-04 DIAGNOSIS — H16123 Filamentary keratitis, bilateral: Secondary | ICD-10-CM | POA: Diagnosis not present

## 2018-09-04 DIAGNOSIS — H5213 Myopia, bilateral: Secondary | ICD-10-CM | POA: Diagnosis not present

## 2018-09-04 DIAGNOSIS — Z961 Presence of intraocular lens: Secondary | ICD-10-CM | POA: Diagnosis not present

## 2018-09-04 DIAGNOSIS — H52222 Regular astigmatism, left eye: Secondary | ICD-10-CM | POA: Diagnosis not present

## 2018-09-04 DIAGNOSIS — H43393 Other vitreous opacities, bilateral: Secondary | ICD-10-CM | POA: Diagnosis not present

## 2018-09-18 DIAGNOSIS — Z23 Encounter for immunization: Secondary | ICD-10-CM | POA: Diagnosis not present

## 2018-09-25 DIAGNOSIS — Z1231 Encounter for screening mammogram for malignant neoplasm of breast: Secondary | ICD-10-CM | POA: Diagnosis not present

## 2018-09-25 DIAGNOSIS — Z853 Personal history of malignant neoplasm of breast: Secondary | ICD-10-CM | POA: Diagnosis not present

## 2019-01-12 DIAGNOSIS — L821 Other seborrheic keratosis: Secondary | ICD-10-CM | POA: Diagnosis not present

## 2019-01-12 DIAGNOSIS — L718 Other rosacea: Secondary | ICD-10-CM | POA: Diagnosis not present

## 2019-01-12 DIAGNOSIS — Z85828 Personal history of other malignant neoplasm of skin: Secondary | ICD-10-CM | POA: Diagnosis not present

## 2019-01-12 DIAGNOSIS — D692 Other nonthrombocytopenic purpura: Secondary | ICD-10-CM | POA: Diagnosis not present

## 2019-01-12 DIAGNOSIS — D2222 Melanocytic nevi of left ear and external auricular canal: Secondary | ICD-10-CM | POA: Diagnosis not present

## 2019-01-12 DIAGNOSIS — L814 Other melanin hyperpigmentation: Secondary | ICD-10-CM | POA: Diagnosis not present

## 2019-01-12 DIAGNOSIS — L905 Scar conditions and fibrosis of skin: Secondary | ICD-10-CM | POA: Diagnosis not present

## 2019-04-22 DIAGNOSIS — N816 Rectocele: Secondary | ICD-10-CM | POA: Diagnosis not present

## 2019-04-22 DIAGNOSIS — N8110 Cystocele, unspecified: Secondary | ICD-10-CM | POA: Diagnosis not present

## 2019-04-22 DIAGNOSIS — Z1389 Encounter for screening for other disorder: Secondary | ICD-10-CM | POA: Diagnosis not present

## 2019-04-22 DIAGNOSIS — K219 Gastro-esophageal reflux disease without esophagitis: Secondary | ICD-10-CM | POA: Diagnosis not present

## 2019-04-22 DIAGNOSIS — N814 Uterovaginal prolapse, unspecified: Secondary | ICD-10-CM | POA: Diagnosis not present

## 2019-04-22 DIAGNOSIS — R32 Unspecified urinary incontinence: Secondary | ICD-10-CM | POA: Diagnosis not present

## 2019-04-22 DIAGNOSIS — Z86010 Personal history of colonic polyps: Secondary | ICD-10-CM | POA: Diagnosis not present

## 2019-04-22 DIAGNOSIS — M8580 Other specified disorders of bone density and structure, unspecified site: Secondary | ICD-10-CM | POA: Diagnosis not present

## 2019-04-22 DIAGNOSIS — Z0000 Encounter for general adult medical examination without abnormal findings: Secondary | ICD-10-CM | POA: Diagnosis not present

## 2019-04-28 ENCOUNTER — Other Ambulatory Visit: Payer: Self-pay | Admitting: Internal Medicine

## 2019-04-28 DIAGNOSIS — M8580 Other specified disorders of bone density and structure, unspecified site: Secondary | ICD-10-CM

## 2019-05-01 DIAGNOSIS — Z86010 Personal history of colonic polyps: Secondary | ICD-10-CM | POA: Diagnosis not present

## 2019-05-13 DIAGNOSIS — Z853 Personal history of malignant neoplasm of breast: Secondary | ICD-10-CM | POA: Diagnosis not present

## 2019-05-13 DIAGNOSIS — K219 Gastro-esophageal reflux disease without esophagitis: Secondary | ICD-10-CM | POA: Diagnosis not present

## 2019-05-13 DIAGNOSIS — Z86010 Personal history of colonic polyps: Secondary | ICD-10-CM | POA: Diagnosis not present

## 2019-05-13 DIAGNOSIS — K921 Melena: Secondary | ICD-10-CM | POA: Diagnosis not present

## 2019-05-13 DIAGNOSIS — R32 Unspecified urinary incontinence: Secondary | ICD-10-CM | POA: Diagnosis not present

## 2019-05-20 DIAGNOSIS — N812 Incomplete uterovaginal prolapse: Secondary | ICD-10-CM | POA: Diagnosis not present

## 2019-05-20 DIAGNOSIS — N3946 Mixed incontinence: Secondary | ICD-10-CM | POA: Diagnosis not present

## 2019-05-20 DIAGNOSIS — R152 Fecal urgency: Secondary | ICD-10-CM | POA: Diagnosis not present

## 2019-05-20 DIAGNOSIS — R159 Full incontinence of feces: Secondary | ICD-10-CM | POA: Diagnosis not present

## 2019-06-04 DIAGNOSIS — D125 Benign neoplasm of sigmoid colon: Secondary | ICD-10-CM | POA: Diagnosis not present

## 2019-06-04 DIAGNOSIS — K640 First degree hemorrhoids: Secondary | ICD-10-CM | POA: Diagnosis not present

## 2019-06-04 DIAGNOSIS — R195 Other fecal abnormalities: Secondary | ICD-10-CM | POA: Diagnosis not present

## 2019-06-04 DIAGNOSIS — K5730 Diverticulosis of large intestine without perforation or abscess without bleeding: Secondary | ICD-10-CM | POA: Diagnosis not present

## 2019-06-09 DIAGNOSIS — D125 Benign neoplasm of sigmoid colon: Secondary | ICD-10-CM | POA: Diagnosis not present

## 2019-06-10 DIAGNOSIS — N3946 Mixed incontinence: Secondary | ICD-10-CM | POA: Diagnosis not present

## 2019-06-10 DIAGNOSIS — R1900 Intra-abdominal and pelvic swelling, mass and lump, unspecified site: Secondary | ICD-10-CM | POA: Diagnosis not present

## 2019-06-10 DIAGNOSIS — N812 Incomplete uterovaginal prolapse: Secondary | ICD-10-CM | POA: Diagnosis not present

## 2019-06-18 DIAGNOSIS — D175 Benign lipomatous neoplasm of intra-abdominal organs: Secondary | ICD-10-CM | POA: Diagnosis not present

## 2019-06-18 DIAGNOSIS — K5730 Diverticulosis of large intestine without perforation or abscess without bleeding: Secondary | ICD-10-CM | POA: Diagnosis not present

## 2019-06-18 DIAGNOSIS — K219 Gastro-esophageal reflux disease without esophagitis: Secondary | ICD-10-CM | POA: Diagnosis not present

## 2019-06-18 DIAGNOSIS — D126 Benign neoplasm of colon, unspecified: Secondary | ICD-10-CM | POA: Diagnosis not present

## 2019-06-18 DIAGNOSIS — R32 Unspecified urinary incontinence: Secondary | ICD-10-CM | POA: Diagnosis not present

## 2019-07-29 DIAGNOSIS — N812 Incomplete uterovaginal prolapse: Secondary | ICD-10-CM | POA: Diagnosis not present

## 2019-07-29 DIAGNOSIS — N8111 Cystocele, midline: Secondary | ICD-10-CM | POA: Diagnosis not present

## 2019-07-29 DIAGNOSIS — N393 Stress incontinence (female) (male): Secondary | ICD-10-CM | POA: Diagnosis not present

## 2019-07-29 DIAGNOSIS — R1900 Intra-abdominal and pelvic swelling, mass and lump, unspecified site: Secondary | ICD-10-CM | POA: Diagnosis not present

## 2019-07-29 DIAGNOSIS — D251 Intramural leiomyoma of uterus: Secondary | ICD-10-CM | POA: Diagnosis not present

## 2019-07-29 DIAGNOSIS — N3946 Mixed incontinence: Secondary | ICD-10-CM | POA: Diagnosis not present

## 2019-07-29 DIAGNOSIS — N3281 Overactive bladder: Secondary | ICD-10-CM | POA: Diagnosis not present

## 2019-07-31 DIAGNOSIS — M4316 Spondylolisthesis, lumbar region: Secondary | ICD-10-CM | POA: Diagnosis not present

## 2019-08-05 DIAGNOSIS — N3946 Mixed incontinence: Secondary | ICD-10-CM | POA: Diagnosis not present

## 2019-08-05 DIAGNOSIS — N812 Incomplete uterovaginal prolapse: Secondary | ICD-10-CM | POA: Diagnosis not present

## 2019-09-03 DIAGNOSIS — Z23 Encounter for immunization: Secondary | ICD-10-CM | POA: Diagnosis not present

## 2019-09-08 DIAGNOSIS — H5213 Myopia, bilateral: Secondary | ICD-10-CM | POA: Diagnosis not present

## 2019-09-08 DIAGNOSIS — H52222 Regular astigmatism, left eye: Secondary | ICD-10-CM | POA: Diagnosis not present

## 2019-09-08 DIAGNOSIS — H02825 Cysts of left lower eyelid: Secondary | ICD-10-CM | POA: Diagnosis not present

## 2019-09-08 DIAGNOSIS — Z961 Presence of intraocular lens: Secondary | ICD-10-CM | POA: Diagnosis not present

## 2019-09-08 DIAGNOSIS — H04123 Dry eye syndrome of bilateral lacrimal glands: Secondary | ICD-10-CM | POA: Diagnosis not present

## 2019-09-10 DIAGNOSIS — N3946 Mixed incontinence: Secondary | ICD-10-CM | POA: Diagnosis not present

## 2019-09-10 DIAGNOSIS — Z79899 Other long term (current) drug therapy: Secondary | ICD-10-CM | POA: Diagnosis not present

## 2019-09-10 DIAGNOSIS — M5136 Other intervertebral disc degeneration, lumbar region: Secondary | ICD-10-CM | POA: Diagnosis not present

## 2019-09-10 DIAGNOSIS — N812 Incomplete uterovaginal prolapse: Secondary | ICD-10-CM | POA: Diagnosis not present

## 2019-09-10 DIAGNOSIS — Z923 Personal history of irradiation: Secondary | ICD-10-CM | POA: Diagnosis not present

## 2019-09-10 DIAGNOSIS — K219 Gastro-esophageal reflux disease without esophagitis: Secondary | ICD-10-CM | POA: Diagnosis not present

## 2019-09-10 DIAGNOSIS — D259 Leiomyoma of uterus, unspecified: Secondary | ICD-10-CM | POA: Diagnosis not present

## 2019-09-10 DIAGNOSIS — Z853 Personal history of malignant neoplasm of breast: Secondary | ICD-10-CM | POA: Diagnosis not present

## 2019-09-10 DIAGNOSIS — Z87891 Personal history of nicotine dependence: Secondary | ICD-10-CM | POA: Diagnosis not present

## 2019-09-17 DIAGNOSIS — Z01812 Encounter for preprocedural laboratory examination: Secondary | ICD-10-CM | POA: Diagnosis not present

## 2019-09-17 DIAGNOSIS — N812 Incomplete uterovaginal prolapse: Secondary | ICD-10-CM | POA: Diagnosis not present

## 2019-09-17 DIAGNOSIS — Z20828 Contact with and (suspected) exposure to other viral communicable diseases: Secondary | ICD-10-CM | POA: Diagnosis not present

## 2019-09-24 DIAGNOSIS — Z923 Personal history of irradiation: Secondary | ICD-10-CM | POA: Diagnosis not present

## 2019-09-24 DIAGNOSIS — N3946 Mixed incontinence: Secondary | ICD-10-CM | POA: Diagnosis not present

## 2019-09-24 DIAGNOSIS — K219 Gastro-esophageal reflux disease without esophagitis: Secondary | ICD-10-CM | POA: Diagnosis not present

## 2019-09-24 DIAGNOSIS — Z853 Personal history of malignant neoplasm of breast: Secondary | ICD-10-CM | POA: Diagnosis not present

## 2019-09-24 DIAGNOSIS — D259 Leiomyoma of uterus, unspecified: Secondary | ICD-10-CM | POA: Diagnosis not present

## 2019-09-24 DIAGNOSIS — N888 Other specified noninflammatory disorders of cervix uteri: Secondary | ICD-10-CM | POA: Diagnosis not present

## 2019-09-24 DIAGNOSIS — N812 Incomplete uterovaginal prolapse: Secondary | ICD-10-CM | POA: Diagnosis not present

## 2019-09-24 DIAGNOSIS — N393 Stress incontinence (female) (male): Secondary | ICD-10-CM | POA: Diagnosis not present

## 2019-09-24 DIAGNOSIS — D251 Intramural leiomyoma of uterus: Secondary | ICD-10-CM | POA: Diagnosis not present

## 2019-09-25 DIAGNOSIS — Z923 Personal history of irradiation: Secondary | ICD-10-CM | POA: Diagnosis not present

## 2019-09-25 DIAGNOSIS — D259 Leiomyoma of uterus, unspecified: Secondary | ICD-10-CM | POA: Diagnosis not present

## 2019-09-25 DIAGNOSIS — K219 Gastro-esophageal reflux disease without esophagitis: Secondary | ICD-10-CM | POA: Diagnosis not present

## 2019-09-25 DIAGNOSIS — N3946 Mixed incontinence: Secondary | ICD-10-CM | POA: Diagnosis not present

## 2019-09-25 DIAGNOSIS — N812 Incomplete uterovaginal prolapse: Secondary | ICD-10-CM | POA: Diagnosis not present

## 2019-09-25 DIAGNOSIS — Z853 Personal history of malignant neoplasm of breast: Secondary | ICD-10-CM | POA: Diagnosis not present

## 2019-09-30 DIAGNOSIS — Z9189 Other specified personal risk factors, not elsewhere classified: Secondary | ICD-10-CM | POA: Diagnosis not present

## 2019-09-30 DIAGNOSIS — Z20828 Contact with and (suspected) exposure to other viral communicable diseases: Secondary | ICD-10-CM | POA: Diagnosis not present

## 2019-10-05 DIAGNOSIS — Z9189 Other specified personal risk factors, not elsewhere classified: Secondary | ICD-10-CM | POA: Diagnosis not present

## 2019-10-08 DIAGNOSIS — Z1231 Encounter for screening mammogram for malignant neoplasm of breast: Secondary | ICD-10-CM | POA: Diagnosis not present

## 2019-10-08 DIAGNOSIS — K219 Gastro-esophageal reflux disease without esophagitis: Secondary | ICD-10-CM | POA: Diagnosis not present

## 2019-10-08 DIAGNOSIS — M810 Age-related osteoporosis without current pathological fracture: Secondary | ICD-10-CM | POA: Diagnosis not present

## 2019-10-08 DIAGNOSIS — Z853 Personal history of malignant neoplasm of breast: Secondary | ICD-10-CM | POA: Diagnosis not present

## 2019-10-08 DIAGNOSIS — Z8262 Family history of osteoporosis: Secondary | ICD-10-CM | POA: Diagnosis not present

## 2019-12-03 DIAGNOSIS — R399 Unspecified symptoms and signs involving the genitourinary system: Secondary | ICD-10-CM | POA: Diagnosis not present

## 2019-12-03 DIAGNOSIS — Z23 Encounter for immunization: Secondary | ICD-10-CM | POA: Diagnosis not present

## 2019-12-29 DIAGNOSIS — Z23 Encounter for immunization: Secondary | ICD-10-CM | POA: Diagnosis not present

## 2019-12-30 DIAGNOSIS — N952 Postmenopausal atrophic vaginitis: Secondary | ICD-10-CM | POA: Diagnosis not present

## 2019-12-30 DIAGNOSIS — N390 Urinary tract infection, site not specified: Secondary | ICD-10-CM | POA: Diagnosis not present

## 2020-01-25 DIAGNOSIS — R399 Unspecified symptoms and signs involving the genitourinary system: Secondary | ICD-10-CM | POA: Diagnosis not present

## 2020-01-29 DIAGNOSIS — N390 Urinary tract infection, site not specified: Secondary | ICD-10-CM | POA: Diagnosis not present

## 2020-02-10 ENCOUNTER — Encounter: Payer: Self-pay | Admitting: Certified Nurse Midwife

## 2020-02-24 DIAGNOSIS — L821 Other seborrheic keratosis: Secondary | ICD-10-CM | POA: Diagnosis not present

## 2020-02-24 DIAGNOSIS — D692 Other nonthrombocytopenic purpura: Secondary | ICD-10-CM | POA: Diagnosis not present

## 2020-02-24 DIAGNOSIS — Z85828 Personal history of other malignant neoplasm of skin: Secondary | ICD-10-CM | POA: Diagnosis not present

## 2020-02-24 DIAGNOSIS — L905 Scar conditions and fibrosis of skin: Secondary | ICD-10-CM | POA: Diagnosis not present

## 2020-02-24 DIAGNOSIS — L570 Actinic keratosis: Secondary | ICD-10-CM | POA: Diagnosis not present

## 2020-02-24 DIAGNOSIS — L718 Other rosacea: Secondary | ICD-10-CM | POA: Diagnosis not present

## 2020-03-23 DIAGNOSIS — K219 Gastro-esophageal reflux disease without esophagitis: Secondary | ICD-10-CM | POA: Diagnosis not present

## 2020-03-23 DIAGNOSIS — R300 Dysuria: Secondary | ICD-10-CM | POA: Diagnosis not present

## 2020-03-23 DIAGNOSIS — R32 Unspecified urinary incontinence: Secondary | ICD-10-CM | POA: Diagnosis not present

## 2020-03-23 DIAGNOSIS — Z0000 Encounter for general adult medical examination without abnormal findings: Secondary | ICD-10-CM | POA: Diagnosis not present

## 2020-03-23 DIAGNOSIS — R030 Elevated blood-pressure reading, without diagnosis of hypertension: Secondary | ICD-10-CM | POA: Diagnosis not present

## 2020-03-23 DIAGNOSIS — N390 Urinary tract infection, site not specified: Secondary | ICD-10-CM | POA: Diagnosis not present

## 2020-03-23 DIAGNOSIS — Z1389 Encounter for screening for other disorder: Secondary | ICD-10-CM | POA: Diagnosis not present

## 2020-03-24 DIAGNOSIS — R300 Dysuria: Secondary | ICD-10-CM | POA: Diagnosis not present

## 2020-04-20 DIAGNOSIS — R109 Unspecified abdominal pain: Secondary | ICD-10-CM | POA: Diagnosis not present

## 2020-04-25 DIAGNOSIS — R399 Unspecified symptoms and signs involving the genitourinary system: Secondary | ICD-10-CM | POA: Diagnosis not present

## 2020-05-17 DIAGNOSIS — R1030 Lower abdominal pain, unspecified: Secondary | ICD-10-CM | POA: Diagnosis not present

## 2020-05-17 DIAGNOSIS — Z90710 Acquired absence of both cervix and uterus: Secondary | ICD-10-CM | POA: Diagnosis not present

## 2020-06-02 ENCOUNTER — Ambulatory Visit (INDEPENDENT_AMBULATORY_CARE_PROVIDER_SITE_OTHER): Payer: Medicare Other

## 2020-06-02 ENCOUNTER — Other Ambulatory Visit: Payer: Self-pay | Admitting: Orthopedic Surgery

## 2020-06-02 ENCOUNTER — Ambulatory Visit (INDEPENDENT_AMBULATORY_CARE_PROVIDER_SITE_OTHER): Payer: Medicare Other | Admitting: Orthopedic Surgery

## 2020-06-02 ENCOUNTER — Encounter: Payer: Self-pay | Admitting: Orthopedic Surgery

## 2020-06-02 DIAGNOSIS — G8929 Other chronic pain: Secondary | ICD-10-CM

## 2020-06-02 DIAGNOSIS — M25562 Pain in left knee: Secondary | ICD-10-CM

## 2020-06-02 NOTE — Progress Notes (Signed)
Office Visit Note   Patient: Jessica Mcdonald           Date of Birth: [birth year]           MRN: [id number] Visit Date: 06/02/2020 Requested by: Lavone Orn, MD [facility] [employer] [address] [place],  [place] [address] PCP: Lavone Orn, MD  Subjective: Chief Complaint  Patient presents with  . Left Knee - Pain    HPI: Jessica Mcdonald is an 84 year old patient with left knee pain.  She was last seen about 10 years ago.  She had a fall directly impacting her left knee about 8 months ago.  She has been going to therapy but is not helping too much anymore.  Localizes the pain to the anterolateral joint line region.  She does have to sleep with a pillow around the back of her knee.  She is having some low back issues as well.  She has been seen by Dr. Ellene Mcdonald in the past for that.  Her knee primarily hurts her at night but she is otherwise able to walk and do a variety of exercise classes with not too much difficulty.  Left knee does not swell.              ROS: All systems reviewed are negative as they relate to the chief complaint within the history of present illness.  Patient denies  fevers or chills.   Assessment & Plan: Visit Diagnoses:  1. Chronic pain of left knee     Plan: Impression is left knee pain.  Radiographs show moderate arthritis in both knees.  No effusion today.  Anterolateral joint line tenderness could represent degenerative meniscal tear.  We considered an injection but she wants to hold off on that for now.  No groin pain with internal extra rotation of the leg.  Mild pronation of the left foot may also contribute to some of this anterior knee pain.  I will see her back as needed.  Follow-Up Instructions: Return if symptoms worsen or fail to improve.   Orders:  No orders of the defined types were placed in this encounter.  No orders of the defined types were placed in this encounter.     Procedures: No procedures performed   Clinical Data: No additional  findings.  Objective: Vital Signs: LMP 11/20/1987   Physical Exam:   Constitutional: Patient appears well-developed HEENT:  Head: Normocephalic Eyes:EOM are normal Neck: Normal range of motion Cardiovascular: Normal rate Pulmonary/chest: Effort normal Neurologic: Patient is alert Skin: Skin is warm Psychiatric: Patient has normal mood and affect    Ortho Exam: Ortho exam demonstrates full active and passive range of motion of both knees with no effusion.  Extensor mechanism is intact.  No real focal medial joint line tenderness but she does have slight anterior lateral joint line tenderness on the left-hand side.  No groin pain with internal extra rotation of the leg.  Pedal pulses palpable.  Collateral and cruciate ligaments are stable.  Specialty Comments:  No specialty comments available.  Imaging: XR KNEE 3 VIEW LEFT  Result Date: 06/02/2020 AP lateral merchant left knee reviewed.  Moderate degenerative changes present with no acute fracture.  Joint spaces maintained.  No loose bodies.  Overall alignment intact.    PMFS History: Patient Active Problem List   Diagnosis Date Noted  . CHRONIC RHINITIS 12/13/2010  . COUGH 07/13/2009  . BREAST CANCER 07/12/2009  . PURE HYPERCHOLESTEROLEMIA 07/12/2009  . MERALGIA PARESTHETICA 07/12/2009  . MACULAR DEGENERATION  07/12/2009  . GERD 07/12/2009  . OSTEOARTHRITIS 07/12/2009  . BACK PAIN, LUMBAR 07/12/2009  . OSTEOPENIA 07/12/2009  . URINARY INCONTINENCE 07/12/2009  . BREAST BIOPSY, BY INCISION, HX OF 07/12/2009   Past Medical History:  Diagnosis Date  . Anorgasmia of female   . Cancer ([place]) 1998   Left breast Lumpectomy, Chemo,Rad  . Cataract 2010 2011  . DDD (degenerative disc disease)   . GERD (gastroesophageal reflux disease) 2000  . Osteoporosis     Family History  Problem Relation Age of Onset  . Osteoporosis Mother   . Heart disease Father     Past Surgical History:  Procedure Laterality Date  . BREAST  SURGERY  3/98   lumpectomy (left)  . CATARACT EXTRACTION Bilateral 2010 2011  . MOHS SURGERY  08/2009   nose, squamous cell ca  . SYNOVIAL CYST EXCISION     lumbar   Social History   Occupational History  . Not on file  Tobacco Use  . Smoking status: Former Smoker    Quit date: 07/18/1963    Years since quitting: 56.9  . Smokeless tobacco: Never Used  Substance and Sexual Activity  . Alcohol use: Yes    Alcohol/week: 10.0 standard drinks    Types: 10 Standard drinks or equivalent per week  . Drug use: No  . Sexual activity: Never    Partners: Male    Birth control/protection: Post-menopausal    Comment: husband vasectomy

## 2020-06-08 DIAGNOSIS — K5901 Slow transit constipation: Secondary | ICD-10-CM | POA: Diagnosis not present

## 2020-06-08 DIAGNOSIS — K5790 Diverticulosis of intestine, part unspecified, without perforation or abscess without bleeding: Secondary | ICD-10-CM | POA: Diagnosis not present

## 2020-06-24 DIAGNOSIS — M79605 Pain in left leg: Secondary | ICD-10-CM | POA: Diagnosis not present

## 2020-06-24 DIAGNOSIS — M79604 Pain in right leg: Secondary | ICD-10-CM | POA: Diagnosis not present

## 2020-06-24 DIAGNOSIS — M6281 Muscle weakness (generalized): Secondary | ICD-10-CM | POA: Diagnosis not present

## 2020-06-24 DIAGNOSIS — M545 Low back pain: Secondary | ICD-10-CM | POA: Diagnosis not present

## 2020-07-04 DIAGNOSIS — M79604 Pain in right leg: Secondary | ICD-10-CM | POA: Diagnosis not present

## 2020-07-04 DIAGNOSIS — M545 Low back pain: Secondary | ICD-10-CM | POA: Diagnosis not present

## 2020-07-04 DIAGNOSIS — M6281 Muscle weakness (generalized): Secondary | ICD-10-CM | POA: Diagnosis not present

## 2020-07-04 DIAGNOSIS — M79605 Pain in left leg: Secondary | ICD-10-CM | POA: Diagnosis not present

## 2020-07-11 ENCOUNTER — Other Ambulatory Visit: Payer: Self-pay | Admitting: Physician Assistant

## 2020-07-11 ENCOUNTER — Ambulatory Visit
Admission: RE | Admit: 2020-07-11 | Discharge: 2020-07-11 | Disposition: A | Discharge status: Home / Self Care | Payer: Medicare Other | Source: Ambulatory Visit | Attending: Physician Assistant | Admitting: Physician Assistant

## 2020-07-11 DIAGNOSIS — D72829 Elevated white blood cell count, unspecified: Secondary | ICD-10-CM | POA: Diagnosis not present

## 2020-07-11 DIAGNOSIS — R1030 Lower abdominal pain, unspecified: Secondary | ICD-10-CM | POA: Diagnosis not present

## 2020-07-11 DIAGNOSIS — K5900 Constipation, unspecified: Secondary | ICD-10-CM | POA: Diagnosis not present

## 2020-07-18 DIAGNOSIS — R799 Abnormal finding of blood chemistry, unspecified: Secondary | ICD-10-CM | POA: Diagnosis not present

## 2020-08-09 DIAGNOSIS — R195 Other fecal abnormalities: Secondary | ICD-10-CM | POA: Diagnosis not present

## 2020-08-09 DIAGNOSIS — N3941 Urge incontinence: Secondary | ICD-10-CM | POA: Diagnosis not present

## 2020-08-09 DIAGNOSIS — N952 Postmenopausal atrophic vaginitis: Secondary | ICD-10-CM | POA: Diagnosis not present

## 2020-08-23 DIAGNOSIS — H16142 Punctate keratitis, left eye: Secondary | ICD-10-CM | POA: Diagnosis not present

## 2020-08-23 DIAGNOSIS — H52222 Regular astigmatism, left eye: Secondary | ICD-10-CM | POA: Diagnosis not present

## 2020-08-23 DIAGNOSIS — H5213 Myopia, bilateral: Secondary | ICD-10-CM | POA: Diagnosis not present

## 2020-08-23 DIAGNOSIS — H18502 Unspecified hereditary corneal dystrophies, left eye: Secondary | ICD-10-CM | POA: Diagnosis not present

## 2020-08-23 DIAGNOSIS — Z961 Presence of intraocular lens: Secondary | ICD-10-CM | POA: Diagnosis not present

## 2020-08-23 DIAGNOSIS — H04123 Dry eye syndrome of bilateral lacrimal glands: Secondary | ICD-10-CM | POA: Diagnosis not present

## 2020-08-24 DIAGNOSIS — N3941 Urge incontinence: Secondary | ICD-10-CM | POA: Diagnosis not present

## 2020-08-24 DIAGNOSIS — N905 Atrophy of vulva: Secondary | ICD-10-CM | POA: Diagnosis not present

## 2020-08-24 DIAGNOSIS — R1030 Lower abdominal pain, unspecified: Secondary | ICD-10-CM | POA: Diagnosis not present

## 2020-08-24 DIAGNOSIS — K5790 Diverticulosis of intestine, part unspecified, without perforation or abscess without bleeding: Secondary | ICD-10-CM | POA: Diagnosis not present

## 2020-08-24 DIAGNOSIS — N824 Other female intestinal-genital tract fistulae: Secondary | ICD-10-CM | POA: Diagnosis not present

## 2020-08-24 DIAGNOSIS — R8281 Pyuria: Secondary | ICD-10-CM | POA: Diagnosis not present

## 2020-08-24 DIAGNOSIS — R399 Unspecified symptoms and signs involving the genitourinary system: Secondary | ICD-10-CM | POA: Diagnosis not present

## 2020-08-30 ENCOUNTER — Other Ambulatory Visit: Payer: Self-pay | Admitting: Obstetrics and Gynecology

## 2020-08-30 DIAGNOSIS — N824 Other female intestinal-genital tract fistulae: Secondary | ICD-10-CM

## 2020-09-08 DIAGNOSIS — H903 Sensorineural hearing loss, bilateral: Secondary | ICD-10-CM | POA: Diagnosis not present

## 2020-09-19 ENCOUNTER — Other Ambulatory Visit: Payer: Self-pay

## 2020-09-19 ENCOUNTER — Ambulatory Visit
Admission: RE | Admit: 2020-09-19 | Discharge: 2020-09-19 | Disposition: A | Discharge status: Home / Self Care | Payer: Medicare Other | Source: Ambulatory Visit | Attending: Obstetrics and Gynecology | Admitting: Obstetrics and Gynecology

## 2020-09-19 DIAGNOSIS — N3289 Other specified disorders of bladder: Secondary | ICD-10-CM | POA: Diagnosis not present

## 2020-09-19 DIAGNOSIS — N824 Other female intestinal-genital tract fistulae: Secondary | ICD-10-CM

## 2020-09-19 DIAGNOSIS — K838 Other specified diseases of biliary tract: Secondary | ICD-10-CM | POA: Diagnosis not present

## 2020-09-19 DIAGNOSIS — N823 Fistula of vagina to large intestine: Secondary | ICD-10-CM | POA: Diagnosis not present

## 2020-09-19 DIAGNOSIS — K5730 Diverticulosis of large intestine without perforation or abscess without bleeding: Secondary | ICD-10-CM | POA: Diagnosis not present

## 2020-09-19 MED ORDER — IOPAMIDOL (ISOVUE-300) INJECTION 61%
100.0000 mL | Freq: Once | INTRAVENOUS | Status: AC | PRN
  Administered 2020-09-19: 100 mL via INTRAVENOUS

## 2020-10-11 DIAGNOSIS — Z1231 Encounter for screening mammogram for malignant neoplasm of breast: Secondary | ICD-10-CM | POA: Diagnosis not present

## 2020-10-18 DIAGNOSIS — R300 Dysuria: Secondary | ICD-10-CM | POA: Diagnosis not present

## 2020-10-18 DIAGNOSIS — N824 Other female intestinal-genital tract fistulae: Secondary | ICD-10-CM | POA: Diagnosis not present

## 2020-10-19 DIAGNOSIS — N3941 Urge incontinence: Secondary | ICD-10-CM | POA: Diagnosis not present

## 2020-10-19 DIAGNOSIS — K5792 Diverticulitis of intestine, part unspecified, without perforation or abscess without bleeding: Secondary | ICD-10-CM | POA: Diagnosis not present

## 2020-10-19 DIAGNOSIS — N321 Vesicointestinal fistula: Secondary | ICD-10-CM | POA: Diagnosis not present

## 2020-10-19 DIAGNOSIS — N3281 Overactive bladder: Secondary | ICD-10-CM | POA: Diagnosis not present

## 2020-11-07 DIAGNOSIS — N390 Urinary tract infection, site not specified: Secondary | ICD-10-CM | POA: Diagnosis not present

## 2020-11-24 DIAGNOSIS — N390 Urinary tract infection, site not specified: Secondary | ICD-10-CM | POA: Diagnosis not present

## 2020-11-24 DIAGNOSIS — N824 Other female intestinal-genital tract fistulae: Secondary | ICD-10-CM | POA: Diagnosis not present

## 2020-11-24 DIAGNOSIS — K5790 Diverticulosis of intestine, part unspecified, without perforation or abscess without bleeding: Secondary | ICD-10-CM | POA: Diagnosis not present

## 2020-12-01 DIAGNOSIS — Z1152 Encounter for screening for COVID-19: Secondary | ICD-10-CM | POA: Diagnosis not present

## 2020-12-09 DIAGNOSIS — Z01812 Encounter for preprocedural laboratory examination: Secondary | ICD-10-CM | POA: Diagnosis not present

## 2020-12-15 DIAGNOSIS — Z20822 Contact with and (suspected) exposure to covid-19: Secondary | ICD-10-CM | POA: Diagnosis not present

## 2020-12-16 ENCOUNTER — Other Ambulatory Visit: Payer: Self-pay | Admitting: Gastroenterology

## 2020-12-19 ENCOUNTER — Other Ambulatory Visit: Payer: Self-pay

## 2020-12-26 ENCOUNTER — Other Ambulatory Visit (HOSPITAL_COMMUNITY)
Admission: RE | Admit: 2020-12-26 | Discharge: 2020-12-26 | Disposition: A | Discharge status: Home / Self Care | Payer: Medicare Other | Source: Ambulatory Visit | Attending: Gastroenterology | Admitting: Gastroenterology

## 2020-12-26 DIAGNOSIS — Z20822 Contact with and (suspected) exposure to covid-19: Secondary | ICD-10-CM | POA: Insufficient documentation

## 2020-12-26 DIAGNOSIS — Z01812 Encounter for preprocedural laboratory examination: Secondary | ICD-10-CM | POA: Insufficient documentation

## 2020-12-27 LAB — SARS CORONAVIRUS 2 (TAT 6-24 HRS): SARS Coronavirus 2: NEGATIVE

## 2020-12-28 DIAGNOSIS — N321 Vesicointestinal fistula: Secondary | ICD-10-CM | POA: Diagnosis not present

## 2020-12-28 DIAGNOSIS — N390 Urinary tract infection, site not specified: Secondary | ICD-10-CM | POA: Diagnosis not present

## 2020-12-28 DIAGNOSIS — R82998 Other abnormal findings in urine: Secondary | ICD-10-CM | POA: Diagnosis not present

## 2020-12-28 DIAGNOSIS — Z87440 Personal history of urinary (tract) infections: Secondary | ICD-10-CM | POA: Diagnosis not present

## 2020-12-28 DIAGNOSIS — N824 Other female intestinal-genital tract fistulae: Secondary | ICD-10-CM | POA: Diagnosis not present

## 2020-12-28 NOTE — Anesthesia Preprocedure Evaluation (Addendum)
Anesthesia Evaluation  Patient identified by MRN, date of birth, ID band Patient awake    Reviewed: Allergy & Precautions, H&P , NPO status , Patient's Chart, lab work & pertinent test results  Airway Mallampati: II  TM Distance: >3 FB Neck ROM: Full    Dental no notable dental hx. (+) Teeth Intact, Dental Advisory Given, Caps   Pulmonary former smoker,    Pulmonary exam normal breath sounds clear to auscultation       Cardiovascular Exercise Tolerance: Good negative cardio ROS Normal cardiovascular exam Rhythm:Regular Rate:Normal     Neuro/Psych PSYCHIATRIC DISORDERS  Neuromuscular disease    GI/Hepatic Neg liver ROS, GERD  Medicated,  Endo/Other  negative endocrine ROS  Renal/GU negative Renal ROS  negative genitourinary   Musculoskeletal  (+) Arthritis , Osteoarthritis,    Abdominal   Peds negative pediatric ROS (+)  Hematology negative hematology ROS (+)   Anesthesia Other Findings   Reproductive/Obstetrics negative OB ROS                            Anesthesia Physical Anesthesia Plan  ASA: II  Anesthesia Plan: MAC   Post-op Pain Management:    Induction:   PONV Risk Score and Plan: 2  Airway Management Planned: Natural Airway and Nasal Cannula  Additional Equipment:   Intra-op Plan:   Post-operative Plan:   Informed Consent: I have reviewed the patients History and Physical, chart, labs and discussed the procedure including the risks, benefits and alternatives for the proposed anesthesia with the patient or authorized representative who has indicated his/her understanding and acceptance.       Plan Discussed with: Anesthesiologist  Anesthesia Plan Comments:         Anesthesia Quick Evaluation

## 2020-12-29 ENCOUNTER — Ambulatory Visit (HOSPITAL_COMMUNITY): Payer: Medicare Other | Admitting: Anesthesiology

## 2020-12-29 ENCOUNTER — Other Ambulatory Visit: Payer: Self-pay

## 2020-12-29 ENCOUNTER — Encounter (HOSPITAL_COMMUNITY): Payer: Self-pay | Admitting: Gastroenterology

## 2020-12-29 ENCOUNTER — Ambulatory Visit (HOSPITAL_COMMUNITY)
Admission: RE | Admit: 2020-12-29 | Discharge: 2020-12-29 | Disposition: A | Discharge status: Home / Self Care | Payer: Medicare Other | Attending: Gastroenterology | Admitting: Gastroenterology

## 2020-12-29 ENCOUNTER — Encounter (HOSPITAL_COMMUNITY)
Admission: RE | Disposition: A | Discharge status: Home / Self Care | Payer: Self-pay | Source: Home / Self Care | Attending: Gastroenterology

## 2020-12-29 DIAGNOSIS — Z79899 Other long term (current) drug therapy: Secondary | ICD-10-CM | POA: Diagnosis not present

## 2020-12-29 DIAGNOSIS — K648 Other hemorrhoids: Secondary | ICD-10-CM | POA: Diagnosis not present

## 2020-12-29 DIAGNOSIS — Z87891 Personal history of nicotine dependence: Secondary | ICD-10-CM | POA: Insufficient documentation

## 2020-12-29 DIAGNOSIS — K6389 Other specified diseases of intestine: Secondary | ICD-10-CM | POA: Diagnosis not present

## 2020-12-29 DIAGNOSIS — K529 Noninfective gastroenteritis and colitis, unspecified: Secondary | ICD-10-CM | POA: Insufficient documentation

## 2020-12-29 DIAGNOSIS — R933 Abnormal findings on diagnostic imaging of other parts of digestive tract: Secondary | ICD-10-CM | POA: Diagnosis not present

## 2020-12-29 DIAGNOSIS — K5730 Diverticulosis of large intestine without perforation or abscess without bleeding: Secondary | ICD-10-CM | POA: Diagnosis not present

## 2020-12-29 HISTORY — PX: FLEXIBLE SIGMOIDOSCOPY: SHX5431

## 2020-12-29 SURGERY — SIGMOIDOSCOPY, FLEXIBLE
Anesthesia: Monitor Anesthesia Care

## 2020-12-29 MED ORDER — PROPOFOL 500 MG/50ML IV EMUL
INTRAVENOUS | Status: DC | PRN
  Administered 2020-12-29: 125 ug/kg/min via INTRAVENOUS

## 2020-12-29 MED ORDER — SODIUM CHLORIDE 0.9 % IV SOLN: INTRAVENOUS | Status: DC

## 2020-12-29 MED ORDER — PROPOFOL 10 MG/ML IV BOLUS
INTRAVENOUS | Status: DC | PRN
  Administered 2020-12-29: 20 mg via INTRAVENOUS
  Administered 2020-12-29: 10 mg via INTRAVENOUS

## 2020-12-29 NOTE — Anesthesia Procedure Notes (Signed)
Procedure Name: MAC Date/Time: 12/29/2020 8:43 AM Performed by: Niel Hummer, CRNA Pre-anesthesia Checklist: Patient identified, Emergency Drugs available, Suction available and Patient being monitored Oxygen Delivery Method: Simple face mask

## 2020-12-29 NOTE — Discharge Instructions (Signed)

## 2020-12-29 NOTE — Transfer of Care (Signed)
Immediate Anesthesia Transfer of Care Note  Patient: Jessica Mcdonald  Procedure(s) Performed: FLEXIBLE SIGMOIDOSCOPY with propofol (N/A )  Patient Location: PACU  Anesthesia Type:MAC  Level of Consciousness: awake  Airway & Oxygen Therapy: Patient Spontanous Breathing and Patient connected to face mask oxygen  Post-op Assessment: Report given to RN, Post -op Vital signs reviewed and stable and Patient moving all extremities X 4  Post vital signs: Reviewed and stable  Last Vitals:  Vitals Value Taken Time  BP    Temp    Pulse 61 12/29/20 0918  Resp 12 12/29/20 0918  SpO2 100 % 12/29/20 0918  Vitals shown include unvalidated device data.  Last Pain:  Vitals:   12/29/20 0750  TempSrc: Oral  PainSc: 0-No pain         Complications: No complications documented.

## 2020-12-29 NOTE — Anesthesia Postprocedure Evaluation (Signed)
Anesthesia Post Note  Patient: Jessica Mcdonald  Procedure(s) Performed: FLEXIBLE SIGMOIDOSCOPY with propofol (N/A )     Patient location during evaluation: PACU Anesthesia Type: MAC Level of consciousness: awake and alert Pain management: pain level controlled Vital Signs Assessment: post-procedure vital signs reviewed and stable Respiratory status: spontaneous breathing, nonlabored ventilation, respiratory function stable and patient connected to nasal cannula oxygen Cardiovascular status: stable and blood pressure returned to baseline Postop Assessment: no apparent nausea or vomiting Anesthetic complications: no   No complications documented.  Last Vitals:  Vitals:   12/29/20 0941 12/29/20 0950  BP: (!) 105/46 (!) 125/34  Pulse: 65 (!) 51  Resp: (!) 21 17  Temp:    SpO2: 99% 100%    Last Pain:  Vitals:   12/29/20 0950  TempSrc:   PainSc: 0-No pain                 Angelise Petrich

## 2020-12-29 NOTE — Op Note (Addendum)
[facility] Patient Name: Jessica Mcdonald Procedure Date: 12/29/2020 MRN: [id number] Attending MD: Otis Brace , MD Date of Birth: December 25, 1935 CSN: [id number] Age: 85 Admit Type: Outpatient Procedure:                Flexible Sigmoidoscopy Indications:              Abnormal CT of the GI tract Providers:                Otis Brace, MD, Burtis Junes, RN, Theodora Blow,                            Technician Referring MD:              Medicines:                Sedation Administered by an Anesthesia Professional Complications:            No immediate complications. Estimated Blood Loss:     Estimated blood loss was minimal. Procedure:                Pre-Anesthesia Assessment:                           - Prior to the procedure, a History and Physical                            was performed, and patient medications and                            allergies were reviewed. The patient's tolerance of                            previous anesthesia was also reviewed. The risks                            and benefits of the procedure and the sedation                            options and risks were discussed with the patient.                            All questions were answered, and informed consent                            was obtained. Prior Anticoagulants: The patient has                            taken no previous anticoagulant or antiplatelet                            agents. ASA Grade Assessment: II - A patient with                            mild systemic disease. After reviewing the risks  and benefits, the patient was deemed in                            satisfactory condition to undergo the procedure.                           After obtaining informed consent, the scope was                            passed under direct vision. The [id number]-H190DL                            ([id number] Olympus pediatric colonscope was                             introduced through the anus and advanced to the the                            rectosigmoid junction. The flexible sigmoidoscopy                            was technically difficult and complex due to                            multiple diverticula in the colon, poor bowel prep                            with stool present and restricted mobility of the                            colon. The quality of the bowel preparation was                            poor. The [id number]-H190 ([id number]) Olympus gastroscope                            was introduced through the and advanced to the. Scope In: 8:52:11 AM Scope Out: 9:14:16 AM Total Procedure Duration: 0 hours 22 minutes 5 seconds  Findings:      Hemorrhoids were found on perianal exam.      A large amount of semi-solid solid stool was found in the recto-sigmoid       colon and in the sigmoid colon, interfering with visualization. Not able       to localize fistula because of poor prep.      Multiple small and large-mouthed diverticula were found in the sigmoid       colon. There was narrowing of the colon in association with the       diverticular opening. Peri-diverticular erythema was seen. The scope was       withdrawn and replaced with the adult endoscope because of difficulty       passing the scope.      A localized area of moderately inflamed and thickened folds of the       mucosa was found at 30 cm proximal to the anus.  Internal hemorrhoids were found during retroflexion. The hemorrhoids       were medium-sized. Impression:               - Preparation of the colon was poor.                           - Hemorrhoids found on perianal exam.                           - Stool in the recto-sigmoid colon and in the                            sigmoid colon.                           - Severe diverticulosis in the sigmoid colon. There                            was narrowing of the colon in association with the                             diverticular opening. Peri-diverticular erythema                            was seen.                           - Inflamed and thickened folds of the mucosa at 30                            cm proximal to the anus.                           - Internal hemorrhoids.                           - No specimens collected. Moderate Sedation:      Moderate (conscious) sedation was personally administered by an       anesthesia professional. The following parameters were monitored: oxygen       saturation, heart rate, blood pressure, and response to care. Recommendation:           - Patient has a contact number available for                            emergencies. The signs and symptoms of potential                            delayed complications were discussed with the                            patient. Return to normal activities tomorrow.                            Written discharge instructions were provided to the  patient.                           - Resume previous diet.                           - Continue present medications.                           - Await pathology results. Procedure Code(s):        --- Professional ---                           [phone], Sigmoidoscopy, flexible; diagnostic,                            including collection of specimen(s) by brushing or                            washing, when performed (separate procedure) Diagnosis Code(s):        --- Professional ---                           K64.8, Other hemorrhoids                           K52.9, Noninfective gastroenteritis and colitis,                            unspecified                           K63.89, Other specified diseases of intestine                           K57.30, Diverticulosis of large intestine without                            perforation or abscess without bleeding                           R93.3, Abnormal findings on diagnostic imaging of                             other parts of digestive tract CPT copyright 2019 [employer]. All rights reserved. The codes documented in this report are preliminary and upon coder review may  be revised to meet current compliance requirements. Otis Brace, MD Otis Brace, MD 12/29/2020 9:23:35 AM Number of Addenda: 0

## 2020-12-29 NOTE — H&P (Signed)
Primary Care Physician:  Lavone Orn, MD Primary Gastroenterologist:  [facility]  Reason for Visit : Flexible sigmoidoscopy for colovesical fistula  HPI: Jessica Mcdonald is a 85 y.o. female with past medical history of hysterectomy with BSO and sling placement in November 2020, colonoscopy in 2020 showed diverticulosis, internal hemorrhoids, lipoma in the sigmoid colon.  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  Complaining of urinary symptoms.  Was seen by surgical team and they recommended flexible sigmoidoscopy prior to any surgical interventions.  Patient continues to have intermittent left lower quadrant pain and urinary symptoms.  Complaining of seeing fecal material in the urine after bowel movement.  Past Medical History:  Diagnosis Date  . Anorgasmia of female   . Cancer ([place]) 1998   Left breast Lumpectomy, Chemo,Rad  . Cataract 2010 2011  . DDD (degenerative disc disease)   . GERD (gastroesophageal reflux disease) 2000  . Osteoporosis     Past Surgical History:  Procedure Laterality Date  . BREAST SURGERY  3/98   lumpectomy (left)  . CATARACT EXTRACTION Bilateral 2010 2011  . MOHS SURGERY  08/2009   nose, squamous cell ca  . SYNOVIAL CYST EXCISION     lumbar    Prior to Admission medications   Medication Sig Start Date End Date Taking? Authorizing Provider  Azelaic Acid 15 % cream Apply 1 application topically 2 (two) times daily. 10/31/20  Yes [provider]  ciprofloxacin (CIPRO) 500 MG tablet Take 500 mg by mouth 2 (two) times daily. 12/15/20  Yes [provider]  estradiol (ESTRACE) 0.1 MG/GM vaginal cream Place 1 Applicatorful vaginally 2 (two) times a week. 10/31/20  Yes [provider]  ipratropium (ATROVENT) 0.06 % nasal spray Place 1-2 sprays into both nostrils See admin instructions. Use 1 to 2 sprays each morning, and may use a second time during the day as  needed for allergies 10/31/20  Yes [provider]  Magnesium 500 MG TABS Take 500 mg by mouth daily.   Yes [provider]  Methylcellulose, Laxative, (CITRUCEL PO) Take 1 Dose by mouth at bedtime.   Yes [provider]  Multiple Minerals-Vitamins (MULTISOURCE CALCIUM MAG/D) TABS Take 2 tablets by mouth daily.   Yes [provider]  Multiple Vitamins-Minerals (MULTIVITAMIN PO) Take 1 tablet by mouth daily.   Yes [provider]  Omega-3 Fatty Acids (FISH OIL) 1000 MG CAPS Take 1,000 mg by mouth See admin instructions. Take 1000 mg 5 to 6 times weekly   Yes [provider]  RESTASIS 0.05 % ophthalmic emulsion Place 1 drop into both eyes 2 (two) times daily. 10/23/20  Yes [provider]  saccharomyces boulardii (FLORASTOR) 250 MG capsule Take 250 mg by mouth daily.   Yes [provider]  TURMERIC PO Take 1,000 mg by mouth See admin instructions. Take 1000 mg 5 to 6 times weekly   Yes [provider]  vitamin B-12 (CYANOCOBALAMIN) 1000 MCG tablet Take 1,000 mcg by mouth See admin instructions. Take 1000 mg 5 to 6 times weekly   Yes [provider]    Scheduled Meds: Continuous Infusions: . sodium chloride     PRN Meds:.  Allergies as of 12/16/2020  . (No Known Allergies)    Family History  Problem Relation Age of Onset  . Osteoporosis Mother   . Heart disease Father     Social History   Socioeconomic History  . Marital status: Married  Spouse name: Not on file  . Number of children: Not on file  . Years of education: Not on file  . Highest education level: Not on file  Occupational History  . Not on file  Tobacco Use  . Smoking status: Former Smoker    Quit date: 07/18/1963    Years since quitting: 57.4  . Smokeless tobacco: Never Used  Substance and Sexual Activity  . Alcohol use: Yes    Alcohol/week: 10.0 standard drinks    Types: 10 Standard drinks or equivalent per week  . Drug  use: No  . Sexual activity: Never    Partners: Male    Birth control/protection: Post-menopausal    Comment: husband vasectomy  Other Topics Concern  . Not on file  Social History Narrative  . Not on file   Social Determinants of Health   Financial Resource Strain: Not on file  Food Insecurity: Not on file  Transportation Needs: Not on file  Physical Activity: Not on file  Stress: Not on file  Social Connections: Not on file  Intimate Partner Violence: Not on file    Review of Systems: All negative except as stated above in HPI.  Physical Exam: Vital signs: Vitals:   12/29/20 0750  BP: (!) 171/68  Pulse: 74  Resp: 16  Temp: 98.5 F (36.9 C)  SpO2: 100%     General:   Alert,  Well-developed, well-nourished, pleasant and cooperative in NAD Lungs:  Clear throughout to auscultation.   No wheezes, crackles, or rhonchi. No acute distress. Heart:  Regular rate and rhythm; no murmurs, clicks, rubs,  or gallops. Abdomen: soft, NT, ND, BS + no peritoneal signs Rectal:  Deferred  GI:  Lab Results: No results for input(s): WBC, HGB, HCT, PLT in the last 72 hours. BMET No results for input(s): NA, K, CL, CO2, GLUCOSE, BUN, CREATININE, CALCIUM in the last 72 hours. LFT No results for input(s): PROT, ALBUMIN, AST, ALT, ALKPHOS, BILITOT, BILIDIR, IBILI in the last 72 hours. PT/INR No results for input(s): LABPROT, INR in the last 72 hours.   Studies/Results: No results found.  Impression/Plan: -Colovesical fistula -History of complicated diverticulitis  Recommendations ------------------------ -Proceed with flexible sigmoidoscopy  Risks (bleeding, infection, bowel perforation that could require surgery, sedation-related changes in cardiopulmonary systems), benefits (identification and possible treatment of source of symptoms, exclusion of certain causes of symptoms), and alternatives (watchful waiting, radiographic imaging studies, empiric medical treatment)  were  explained to patient in detail and patient wishes to proceed.    LOS: 0 days   Otis Brace  MD, FACP 12/29/2020, 8:29 AM  Contact #  [phone]

## 2020-12-30 ENCOUNTER — Encounter (HOSPITAL_COMMUNITY): Payer: Self-pay | Admitting: Gastroenterology

## 2021-01-05 DIAGNOSIS — R933 Abnormal findings on diagnostic imaging of other parts of digestive tract: Secondary | ICD-10-CM | POA: Diagnosis not present

## 2021-01-05 DIAGNOSIS — K648 Other hemorrhoids: Secondary | ICD-10-CM | POA: Diagnosis not present

## 2021-01-05 DIAGNOSIS — K6389 Other specified diseases of intestine: Secondary | ICD-10-CM | POA: Diagnosis not present

## 2021-01-05 DIAGNOSIS — K529 Noninfective gastroenteritis and colitis, unspecified: Secondary | ICD-10-CM | POA: Diagnosis not present

## 2021-01-05 DIAGNOSIS — K5730 Diverticulosis of large intestine without perforation or abscess without bleeding: Secondary | ICD-10-CM | POA: Diagnosis not present

## 2021-01-13 DIAGNOSIS — N824 Other female intestinal-genital tract fistulae: Secondary | ICD-10-CM | POA: Diagnosis not present

## 2021-01-25 DIAGNOSIS — N390 Urinary tract infection, site not specified: Secondary | ICD-10-CM | POA: Diagnosis not present

## 2021-02-13 ENCOUNTER — Other Ambulatory Visit: Payer: Self-pay | Admitting: Urology

## 2021-03-06 ENCOUNTER — Other Ambulatory Visit (HOSPITAL_COMMUNITY): Payer: Self-pay

## 2021-03-07 ENCOUNTER — Other Ambulatory Visit (HOSPITAL_COMMUNITY): Payer: Self-pay

## 2021-03-07 DIAGNOSIS — Z23 Encounter for immunization: Secondary | ICD-10-CM | POA: Diagnosis not present

## 2021-03-08 DIAGNOSIS — L570 Actinic keratosis: Secondary | ICD-10-CM | POA: Diagnosis not present

## 2021-03-08 DIAGNOSIS — L821 Other seborrheic keratosis: Secondary | ICD-10-CM | POA: Diagnosis not present

## 2021-03-08 DIAGNOSIS — Z85828 Personal history of other malignant neoplasm of skin: Secondary | ICD-10-CM | POA: Diagnosis not present

## 2021-03-08 DIAGNOSIS — D692 Other nonthrombocytopenic purpura: Secondary | ICD-10-CM | POA: Diagnosis not present

## 2021-03-14 NOTE — Patient Instructions (Addendum)
DUE TO COVID-19 ONLY ONE VISITOR IS ALLOWED TO COME WITH YOU AND STAY IN THE WAITING ROOM ONLY DURING PRE OP AND PROCEDURE DAY OF SURGERY. THE 2 VISITORS  MAY VISIT WITH YOU AFTER SURGERY IN YOUR PRIVATE ROOM DURING VISITING HOURS ONLY!  YOU NEED TO HAVE A COVID 19 TEST ON____5/2___ @__10 :15_____, THIS TEST MUST BE DONE BEFORE SURGERY,  COVID TESTING SITE [place] [id number], IT IS ON THE RIGHT GOING OUT [address] APPROXIMATELY  2 MINUTES PAST [employer] ON THE RIGHT. ONCE YOUR COVID TEST IS COMPLETED,  PLEASE BEGIN THE QUARANTINE INSTRUCTIONS AS OUTLINED IN YOUR HANDOUT.                Jessica Mcdonald    Your procedure is scheduled on: 03/22/21   Report to [facility] Main  Entrance   Report to admitting at  6:00 AM     Call this number if you have problems the morning of surgery [phone]   Follow all instructions for bowel prep.  Call office about bowel prep.  Drink plenty of fluids on prep day to prevent dehydration     NO SOLIDS AFTER MIDNIGHT THE DAY PRIOR TO THE SURGERY.  NOTHING BY MOUTH EXCEPT CLEAR LIQUIDS UNTIL 5:30 AM.     BRUSH YOUR TEETH MORNING OF SURGERY AND RINSE YOUR MOUTH OUT, NO CHEWING GUM CANDY OR MINTS.     Take these medicines the morning of surgery with A SIP OF WATER: none                                You may not have any metal on your body including hair pins and              piercings  Do not wear jewelry, make-up, lotions, powders or perfumes, deodorant             Do not wear nail polish on your fingernails.  Do not shave  48 hours prior to surgery.              Do not bring valuables to the hospital. [employer].  Contacts, dentures or bridgework may not be worn into surgery.                  [facility] - Preparing for Surgery Before surgery, you can play an important role.  Because skin is not sterile, your skin needs to be as free of germs  as possible.  You can reduce the number of germs on your skin by washing with CHG (chlorahexidine gluconate) soap before surgery.  CHG is an antiseptic cleaner which kills germs and bonds with the skin to continue killing germs even after washing. Please DO NOT use if you have an allergy to CHG or antibacterial soaps.  If your skin becomes reddened/irritated stop using the CHG and inform your nurse when you arrive at Short Stay. Do not shave (including legs and underarms) for at least 48 hours prior to the first CHG shower Please follow these instructions carefully:  1.  Shower with CHG Soap the night before surgery and the  morning of Surgery.  2.  If you choose to wash your hair, wash your hair first as usual with your  normal  shampoo.  3.  After you shampoo, rinse your hair and body thoroughly to remove the  shampoo.                                        4.  Use CHG as you would any other liquid soap.  You can apply chg directly  to the skin and wash                       Gently with a scrungie or clean washcloth.  5.  Apply the CHG Soap to your body ONLY FROM THE NECK DOWN.   Do not use on face/ open                           Wound or open sores. Avoid contact with eyes, ears mouth and genitals (private parts).                       Wash face,  Genitals (private parts) with your normal soap.             6.  Wash thoroughly, paying special attention to the area where your surgery  will be performed.  7.  Thoroughly rinse your body with warm water from the neck down.  8.  DO NOT shower/wash with your normal soap after using and rinsing off  the CHG Soap.             9.  Pat yourself dry with a clean towel.            10.  Wear clean pajamas.            11.  Place clean sheets on your bed the night of your first shower and do not  sleep with pets. Day of Surgery : Do not apply any lotions/deodorants the morning of surgery.  Please wear clean clothes to the hospital/surgery center.  FAILURE TO  FOLLOW THESE INSTRUCTIONS MAY RESULT IN THE CANCELLATION OF YOUR SURGERY PATIENT SIGNATURE_________________________________  NURSE SIGNATURE__________________________________  ________________________________________________________________________   Jessica Mcdonald  An incentive spirometer is a tool that can help keep your lungs clear and active. This tool measures how well you are filling your lungs with each breath. Taking long deep breaths may help reverse or decrease the chance of developing breathing (pulmonary) problems (especially infection) following:  A long period of time when you are unable to move or be active. BEFORE THE PROCEDURE   If the spirometer includes an indicator to show your best effort, your nurse or respiratory therapist will set it to a desired goal.  If possible, sit up straight or lean slightly forward. Try not to slouch.  Hold the incentive spirometer in an upright position. INSTRUCTIONS FOR USE  1. Sit on the edge of your bed if possible, or sit up as far as you can in bed or on a chair. 2. Hold the incentive spirometer in an upright position. 3. Breathe out normally. 4. Place the mouthpiece in your mouth and seal your lips tightly around it. 5. Breathe in slowly and as deeply as possible, raising the piston or the ball toward the top of the column. 6. Hold your breath for 3-5 seconds or for as long as possible. Allow the piston or ball to fall to the bottom of the column. 7. Remove the mouthpiece from  your mouth and breathe out normally. 8. Rest for a few seconds and repeat Steps 1 through 7 at least 10 times every 1-2 hours when you are awake. Take your time and take a few normal breaths between deep breaths. 9. The spirometer may include an indicator to show your best effort. Use the indicator as a goal to work toward during each repetition. 10. After each set of 10 deep breaths, practice coughing to be sure your lungs are clear. If you have an  incision (the cut made at the time of surgery), support your incision when coughing by placing a pillow or rolled up towels firmly against it. Once you are able to get out of bed, walk around indoors and cough well. You may stop using the incentive spirometer when instructed by your caregiver.  RISKS AND COMPLICATIONS  Take your time so you do not get dizzy or light-headed.  If you are in pain, you may need to take or ask for pain medication before doing incentive spirometry. It is harder to take a deep breath if you are having pain. AFTER USE  Rest and breathe slowly and easily.  It can be helpful to keep track of a log of your progress. Your caregiver can provide you with a simple table to help with this. If you are using the spirometer at home, follow these instructions: [place] IF:   You are having difficultly using the spirometer.  You have trouble using the spirometer as often as instructed.  Your pain medication is not giving enough relief while using the spirometer.  You develop fever of 100.5 F (38.1 C) or higher. SEEK IMMEDIATE MEDICAL CARE IF:   You cough up bloody sputum that had not been present before.  You develop fever of 102 F (38.9 C) or greater.  You develop worsening pain at or near the incision site. MAKE SURE YOU:   Understand these instructions.  Will watch your condition.  Will get help right away if you are not doing well or get worse. Document Released: 03/18/2007 Document Revised: 01/28/2012 Document Reviewed: 05/19/2007 [facility] Patient Information 2014 [place], [place].   ________________________________________________________________________

## 2021-03-16 ENCOUNTER — Encounter (HOSPITAL_COMMUNITY): Payer: Self-pay

## 2021-03-16 ENCOUNTER — Encounter (HOSPITAL_COMMUNITY)
Admission: RE | Admit: 2021-03-16 | Discharge: 2021-03-16 | Disposition: A | Discharge status: Home / Self Care | Payer: Medicare Other | Source: Ambulatory Visit | Attending: Surgery | Admitting: Surgery

## 2021-03-16 ENCOUNTER — Other Ambulatory Visit: Payer: Self-pay

## 2021-03-16 DIAGNOSIS — Z01818 Encounter for other preprocedural examination: Secondary | ICD-10-CM | POA: Diagnosis not present

## 2021-03-16 LAB — BASIC METABOLIC PANEL
Anion gap: 6 (ref 5–15)
BUN: 18 mg/dL (ref 8–23)
CO2: 26 mmol/L (ref 22–32)
Calcium: 10.5 mg/dL — ABNORMAL HIGH (ref 8.9–10.3)
Chloride: 108 mmol/L (ref 98–111)
Creatinine, Ser: 0.7 mg/dL (ref 0.44–1.00)
GFR, Estimated: >60 mL/min (ref >60)
Glucose, Bld: 96 mg/dL (ref 70–99)
Potassium: 4.4 mmol/L (ref 3.5–5.1)
Sodium: 140 mmol/L (ref 135–145)

## 2021-03-16 LAB — CBC
HCT: 44.3 % (ref 36.0–46.0)
Hemoglobin: 14.7 g/dL (ref 12.0–15.0)
MCH: 30.8 pg (ref 26.0–34.0)
MCHC: 33.2 g/dL (ref 30.0–36.0)
MCV: 92.7 fL (ref 80.0–100.0)
Platelets: 364 10*3/uL (ref 150–400)
RBC: 4.78 MIL/uL (ref 3.87–5.11)
RDW: 14.6 % (ref 11.5–15.5)
WBC: 10 10*3/uL (ref 4.0–10.5)
nRBC: 0 % (ref 0.0–0.2)

## 2021-03-16 NOTE — Progress Notes (Signed)
COVID Vaccine Completed: Yes 12/30/19-booster 10/05/20, 03/07/21 Date COVID Vaccine completed: COVID vaccine manufacturer:     Moderna     PCP - Dr. Electa Sniff Cardiologist - none  Chest x-ray - no EKG -03/16/21-chart, epic  Stress Test - no ECHO - no Cardiac Cath - no Pacemaker/ICD device last checked:NA  Sleep Study - no CPAP -   Fasting Blood Sugar - NA Checks Blood Sugar _____ times a day  Blood Thinner Instructions:NA Aspirin Instructions: Last Dose:  Anesthesia review:   Patient denies shortness of breath, fever, cough and chest pain at PAT appointment  Yes. Pt works out daily and has no SOB with any activities.  Patient verbalized understanding of instructions that were given to them at the PAT appointment. Patient was also instructed that they will need to review over the PAT instructions again at home before surgery.Yes

## 2021-03-20 ENCOUNTER — Other Ambulatory Visit (HOSPITAL_COMMUNITY)
Admission: RE | Admit: 2021-03-20 | Discharge: 2021-03-20 | Disposition: A | Discharge status: Home / Self Care | Payer: Medicare Other | Source: Ambulatory Visit | Attending: Surgery | Admitting: Surgery

## 2021-03-20 DIAGNOSIS — Z20822 Contact with and (suspected) exposure to covid-19: Secondary | ICD-10-CM | POA: Insufficient documentation

## 2021-03-20 DIAGNOSIS — Z01812 Encounter for preprocedural laboratory examination: Secondary | ICD-10-CM | POA: Insufficient documentation

## 2021-03-21 ENCOUNTER — Ambulatory Visit: Payer: Self-pay | Admitting: Surgery

## 2021-03-21 LAB — SARS CORONAVIRUS 2 (TAT 6-24 HRS): SARS Coronavirus 2: NEGATIVE

## 2021-03-21 NOTE — Anesthesia Preprocedure Evaluation (Addendum)
Anesthesia Evaluation  Patient identified by MRN, date of birth, ID band Patient awake    Reviewed: Allergy & Precautions, NPO status , Patient's Chart, lab work & pertinent test results  Airway Mallampati: I  TM Distance: >3 FB Neck ROM: Full    Dental no notable dental hx. (+) Teeth Intact, Dental Advisory Given   Pulmonary neg pulmonary ROS, former smoker,    Pulmonary exam normal breath sounds clear to auscultation       Cardiovascular negative cardio ROS Normal cardiovascular exam Rhythm:Regular Rate:Normal     Neuro/Psych negative neurological ROS  negative psych ROS   GI/Hepatic Neg liver ROS, GERD  Controlled and Medicated,  Endo/Other  negative endocrine ROS  Renal/GU negative Renal ROS  negative genitourinary   Musculoskeletal  (+) Arthritis , Osteoarthritis,    Abdominal   Peds  Hematology negative hematology ROS (+)   Anesthesia Other Findings Colovesical fistula in setting of history of complicated diverticulitis  Reproductive/Obstetrics                            Anesthesia Physical Anesthesia Plan  ASA: II  Anesthesia Plan: General   Post-op Pain Management:    Induction: Intravenous  PONV Risk Score and Plan: Midazolam, Dexamethasone and Ondansetron  Airway Management Planned: Oral ETT  Additional Equipment:   Intra-op Plan:   Post-operative Plan: Extubation in OR  Informed Consent: I have reviewed the patients History and Physical, chart, labs and discussed the procedure including the risks, benefits and alternatives for the proposed anesthesia with the patient or authorized representative who has indicated his/her understanding and acceptance.     Dental advisory given  Plan Discussed with: CRNA  Anesthesia Plan Comments: (2 IVs)        Anesthesia Quick Evaluation

## 2021-03-22 ENCOUNTER — Inpatient Hospital Stay (HOSPITAL_COMMUNITY): Payer: Medicare Other | Admitting: Certified Registered Nurse Anesthetist

## 2021-03-22 ENCOUNTER — Encounter (HOSPITAL_COMMUNITY)
Admission: RE | Disposition: A | Discharge status: Home / Self Care | Payer: Self-pay | Source: Home / Self Care | Attending: Surgery

## 2021-03-22 ENCOUNTER — Inpatient Hospital Stay (HOSPITAL_COMMUNITY): Payer: Medicare Other

## 2021-03-22 ENCOUNTER — Encounter (HOSPITAL_COMMUNITY): Payer: Self-pay | Admitting: Surgery

## 2021-03-22 ENCOUNTER — Other Ambulatory Visit: Payer: Self-pay

## 2021-03-22 ENCOUNTER — Inpatient Hospital Stay (HOSPITAL_COMMUNITY)
Admission: RE | Admit: 2021-03-22 | Discharge: 2021-03-25 | DRG: 331 | Disposition: A | Discharge status: Home / Self Care | Payer: Medicare Other | Attending: Surgery | Admitting: Surgery

## 2021-03-22 DIAGNOSIS — Z853 Personal history of malignant neoplasm of breast: Secondary | ICD-10-CM

## 2021-03-22 DIAGNOSIS — K660 Peritoneal adhesions (postprocedural) (postinfection): Secondary | ICD-10-CM | POA: Diagnosis present

## 2021-03-22 DIAGNOSIS — Z8249 Family history of ischemic heart disease and other diseases of the circulatory system: Secondary | ICD-10-CM

## 2021-03-22 DIAGNOSIS — K632 Fistula of intestine: Secondary | ICD-10-CM | POA: Diagnosis not present

## 2021-03-22 DIAGNOSIS — Z87891 Personal history of nicotine dependence: Secondary | ICD-10-CM | POA: Diagnosis not present

## 2021-03-22 DIAGNOSIS — Z8262 Family history of osteoporosis: Secondary | ICD-10-CM

## 2021-03-22 DIAGNOSIS — Z20822 Contact with and (suspected) exposure to covid-19: Secondary | ICD-10-CM | POA: Diagnosis present

## 2021-03-22 DIAGNOSIS — M1990 Unspecified osteoarthritis, unspecified site: Secondary | ICD-10-CM | POA: Diagnosis present

## 2021-03-22 DIAGNOSIS — H3530 Unspecified macular degeneration: Secondary | ICD-10-CM | POA: Diagnosis present

## 2021-03-22 DIAGNOSIS — E7800 Pure hypercholesterolemia, unspecified: Secondary | ICD-10-CM | POA: Diagnosis present

## 2021-03-22 DIAGNOSIS — K5730 Diverticulosis of large intestine without perforation or abscess without bleeding: Secondary | ICD-10-CM | POA: Diagnosis present

## 2021-03-22 DIAGNOSIS — M810 Age-related osteoporosis without current pathological fracture: Secondary | ICD-10-CM | POA: Diagnosis present

## 2021-03-22 DIAGNOSIS — N823 Fistula of vagina to large intestine: Principal | ICD-10-CM | POA: Diagnosis present

## 2021-03-22 DIAGNOSIS — Z9049 Acquired absence of other specified parts of digestive tract: Secondary | ICD-10-CM

## 2021-03-22 DIAGNOSIS — Z79899 Other long term (current) drug therapy: Secondary | ICD-10-CM | POA: Diagnosis not present

## 2021-03-22 DIAGNOSIS — D175 Benign lipomatous neoplasm of intra-abdominal organs: Secondary | ICD-10-CM | POA: Diagnosis not present

## 2021-03-22 DIAGNOSIS — K219 Gastro-esophageal reflux disease without esophagitis: Secondary | ICD-10-CM | POA: Diagnosis present

## 2021-03-22 DIAGNOSIS — M8580 Other specified disorders of bone density and structure, unspecified site: Secondary | ICD-10-CM | POA: Diagnosis present

## 2021-03-22 HISTORY — PX: XI ROBOTIC ASSISTED LOWER ANTERIOR RESECTION: SHX6558

## 2021-03-22 HISTORY — PX: FLEXIBLE SIGMOIDOSCOPY: SHX5431

## 2021-03-22 LAB — TYPE AND SCREEN
ABO/RH(D): A POS
Antibody Screen: NEGATIVE

## 2021-03-22 LAB — ABO/RH: ABO/RH(D): A POS

## 2021-03-22 SURGERY — RESECTION, RECTUM, LOW ANTERIOR, ROBOT-ASSISTED
Anesthesia: General | Site: Bladder

## 2021-03-22 MED ORDER — 0.9 % SODIUM CHLORIDE (POUR BTL) OPTIME
TOPICAL | Status: DC | PRN
  Administered 2021-03-22: 2000 mL

## 2021-03-22 MED ORDER — BISACODYL 5 MG PO TBEC: 20.0000 mg | DELAYED_RELEASE_TABLET | Freq: Once | ORAL | Status: DC

## 2021-03-22 MED ORDER — CHLORHEXIDINE GLUCONATE 0.12 % MT SOLN
15.0000 mL | Freq: Once | OROMUCOSAL | Status: AC
  Administered 2021-03-22: 15 mL via OROMUCOSAL

## 2021-03-22 MED ORDER — STERILE WATER FOR INJECTION IJ SOLN
INTRAMUSCULAR | Status: AC
  Filled 2021-03-22: qty 10

## 2021-03-22 MED ORDER — FENTANYL CITRATE (PF) 250 MCG/5ML IJ SOLN
INTRAMUSCULAR | Status: DC | PRN
  Administered 2021-03-22: 50 ug via INTRAVENOUS
  Administered 2021-03-22: 100 ug via INTRAVENOUS
  Administered 2021-03-22 (×2): 50 ug via INTRAVENOUS

## 2021-03-22 MED ORDER — IBUPROFEN 200 MG PO TABS
600.0000 mg | ORAL_TABLET | Freq: Four times a day (QID) | ORAL | Status: DC | PRN
  Administered 2021-03-23: 600 mg via ORAL
  Filled 2021-03-22: qty 3

## 2021-03-22 MED ORDER — CHLORHEXIDINE GLUCONATE CLOTH 2 % EX PADS: 6.0000 | MEDICATED_PAD | Freq: Once | CUTANEOUS | Status: DC

## 2021-03-22 MED ORDER — STERILE WATER FOR INJECTION IJ SOLN
INTRAMUSCULAR | Status: DC | PRN
  Administered 2021-03-22: 15 mL via INTRAMUSCULAR

## 2021-03-22 MED ORDER — METRONIDAZOLE 500 MG PO TABS: 1000.0000 mg | ORAL_TABLET | ORAL | Status: DC

## 2021-03-22 MED ORDER — DIPHENHYDRAMINE HCL 50 MG/ML IJ SOLN: 12.5000 mg | Freq: Four times a day (QID) | INTRAMUSCULAR | Status: DC | PRN

## 2021-03-22 MED ORDER — ACETAMINOPHEN 500 MG PO TABS
1000.0000 mg | ORAL_TABLET | ORAL | Status: AC
  Administered 2021-03-22: 1000 mg via ORAL
  Filled 2021-03-22: qty 2

## 2021-03-22 MED ORDER — LACTATED RINGERS IV SOLN: INTRAVENOUS | Status: DC

## 2021-03-22 MED ORDER — ALUM & MAG HYDROXIDE-SIMETH 200-200-20 MG/5ML PO SUSP: 30.0000 mL | Freq: Four times a day (QID) | ORAL | Status: DC | PRN

## 2021-03-22 MED ORDER — IOHEXOL 300 MG/ML  SOLN
INTRAMUSCULAR | Status: DC | PRN
  Administered 2021-03-22: 10 mL

## 2021-03-22 MED ORDER — STERILE WATER FOR IRRIGATION IR SOLN
Status: DC | PRN
  Administered 2021-03-22: 3000 mL

## 2021-03-22 MED ORDER — HYDROMORPHONE HCL 1 MG/ML IJ SOLN
INTRAMUSCULAR | Status: DC | PRN
  Administered 2021-03-22 (×2): .4 mg via INTRAVENOUS

## 2021-03-22 MED ORDER — ONDANSETRON HCL 4 MG/2ML IJ SOLN
INTRAMUSCULAR | Status: AC
  Filled 2021-03-22: qty 2

## 2021-03-22 MED ORDER — LIDOCAINE 20MG/ML (2%) 15 ML SYRINGE OPTIME
INTRAMUSCULAR | Status: DC | PRN
  Administered 2021-03-22: .5 mg/kg/h via INTRAVENOUS

## 2021-03-22 MED ORDER — SODIUM CHLORIDE 0.9 % IV SOLN
2.0000 g | INTRAVENOUS | Status: AC
  Administered 2021-03-22: 2 g via INTRAVENOUS
  Filled 2021-03-22: qty 2

## 2021-03-22 MED ORDER — HYDROMORPHONE HCL 1 MG/ML IJ SOLN
0.5000 mg | INTRAMUSCULAR | Status: DC | PRN
Start: 2021-03-22 — End: 2021-03-25

## 2021-03-22 MED ORDER — EPHEDRINE SULFATE-NACL 50-0.9 MG/10ML-% IV SOSY
PREFILLED_SYRINGE | INTRAVENOUS | Status: DC | PRN
  Administered 2021-03-22 (×3): 5 mg via INTRAVENOUS

## 2021-03-22 MED ORDER — PROPOFOL 10 MG/ML IV BOLUS
INTRAVENOUS | Status: DC | PRN
  Administered 2021-03-22: 100 mg via INTRAVENOUS

## 2021-03-22 MED ORDER — BUPIVACAINE-EPINEPHRINE (PF) 0.25% -1:200000 IJ SOLN
INTRAMUSCULAR | Status: AC
  Filled 2021-03-22: qty 30

## 2021-03-22 MED ORDER — SODIUM CHLORIDE (PF) 0.9 % IJ SOLN
INTRAMUSCULAR | Status: AC
  Filled 2021-03-22: qty 10

## 2021-03-22 MED ORDER — PROPOFOL 10 MG/ML IV BOLUS
INTRAVENOUS | Status: AC
  Filled 2021-03-22: qty 20

## 2021-03-22 MED ORDER — IPRATROPIUM BROMIDE 0.06 % NA SOLN
1.0000 | Freq: Every day | NASAL | Status: DC
  Administered 2021-03-23 – 2021-03-24 (×2): 1 via NASAL
  Filled 2021-03-22: qty 15

## 2021-03-22 MED ORDER — ACETAMINOPHEN 10 MG/ML IV SOLN
INTRAVENOUS | Status: AC
  Filled 2021-03-22: qty 100

## 2021-03-22 MED ORDER — LABETALOL HCL 5 MG/ML IV SOLN
INTRAVENOUS | Status: DC | PRN
  Administered 2021-03-22 (×2): 2.5 mg via INTRAVENOUS

## 2021-03-22 MED ORDER — ROCURONIUM BROMIDE 10 MG/ML (PF) SYRINGE
PREFILLED_SYRINGE | INTRAVENOUS | Status: DC | PRN
  Administered 2021-03-22: 30 mg via INTRAVENOUS
  Administered 2021-03-22: 50 mg via INTRAVENOUS
  Administered 2021-03-22: 20 mg via INTRAVENOUS

## 2021-03-22 MED ORDER — FENTANYL CITRATE (PF) 100 MCG/2ML IJ SOLN
INTRAMUSCULAR | Status: AC
  Filled 2021-03-22: qty 2

## 2021-03-22 MED ORDER — DEXAMETHASONE SODIUM PHOSPHATE 10 MG/ML IJ SOLN
INTRAMUSCULAR | Status: DC | PRN
  Administered 2021-03-22: 5 mg via INTRAVENOUS

## 2021-03-22 MED ORDER — ONDANSETRON HCL 4 MG PO TABS: 4.0000 mg | ORAL_TABLET | Freq: Four times a day (QID) | ORAL | Status: DC | PRN

## 2021-03-22 MED ORDER — ENSURE SURGERY PO LIQD
237.0000 mL | Freq: Two times a day (BID) | ORAL | Status: DC
  Administered 2021-03-23 – 2021-03-24 (×4): 237 mL via ORAL
  Filled 2021-03-22 (×7): qty 237

## 2021-03-22 MED ORDER — ONDANSETRON HCL 4 MG/2ML IJ SOLN: 4.0000 mg | Freq: Four times a day (QID) | INTRAMUSCULAR | Status: DC | PRN

## 2021-03-22 MED ORDER — PHENYLEPHRINE HCL-NACL 10-0.9 MG/250ML-% IV SOLN
INTRAVENOUS | Status: DC | PRN
  Administered 2021-03-22: 40 ug/min via INTRAVENOUS

## 2021-03-22 MED ORDER — ROCURONIUM BROMIDE 10 MG/ML (PF) SYRINGE
PREFILLED_SYRINGE | INTRAVENOUS | Status: AC
  Filled 2021-03-22: qty 10

## 2021-03-22 MED ORDER — BUPIVACAINE LIPOSOME 1.3 % IJ SUSP
20.0000 mL | Freq: Once | INTRAMUSCULAR | Status: DC
  Filled 2021-03-22: qty 20

## 2021-03-22 MED ORDER — PHENYLEPHRINE 40 MCG/ML (10ML) SYRINGE FOR IV PUSH (FOR BLOOD PRESSURE SUPPORT)
PREFILLED_SYRINGE | INTRAVENOUS | Status: DC | PRN
  Administered 2021-03-22: 120 ug via INTRAVENOUS
  Administered 2021-03-22: 200 ug via INTRAVENOUS

## 2021-03-22 MED ORDER — HEPARIN SODIUM (PORCINE) 5000 UNIT/ML IJ SOLN
5000.0000 [IU] | Freq: Three times a day (TID) | INTRAMUSCULAR | Status: DC
  Administered 2021-03-22 – 2021-03-25 (×8): 5000 [IU] via SUBCUTANEOUS
  Filled 2021-03-22 (×8): qty 1

## 2021-03-22 MED ORDER — SIMETHICONE 80 MG PO CHEW: 40.0000 mg | CHEWABLE_TABLET | Freq: Four times a day (QID) | ORAL | Status: DC | PRN

## 2021-03-22 MED ORDER — FENTANYL CITRATE (PF) 100 MCG/2ML IJ SOLN
25.0000 ug | INTRAMUSCULAR | Status: DC | PRN
  Administered 2021-03-22: 50 ug via INTRAVENOUS

## 2021-03-22 MED ORDER — SUGAMMADEX SODIUM 200 MG/2ML IV SOLN
INTRAVENOUS | Status: DC | PRN
  Administered 2021-03-22: 200 mg via INTRAVENOUS

## 2021-03-22 MED ORDER — DIPHENHYDRAMINE HCL 12.5 MG/5ML PO ELIX
12.5000 mg | ORAL_SOLUTION | Freq: Four times a day (QID) | ORAL | Status: DC | PRN
Start: 2021-03-22 — End: 2021-03-25

## 2021-03-22 MED ORDER — PHENYLEPHRINE 40 MCG/ML (10ML) SYRINGE FOR IV PUSH (FOR BLOOD PRESSURE SUPPORT)
PREFILLED_SYRINGE | INTRAVENOUS | Status: AC
  Filled 2021-03-22: qty 10

## 2021-03-22 MED ORDER — ACETAMINOPHEN 500 MG PO TABS
1000.0000 mg | ORAL_TABLET | Freq: Four times a day (QID) | ORAL | Status: DC
  Administered 2021-03-22 – 2021-03-25 (×10): 1000 mg via ORAL
  Filled 2021-03-22 (×10): qty 2

## 2021-03-22 MED ORDER — NEOMYCIN SULFATE 500 MG PO TABS: 1000.0000 mg | ORAL_TABLET | ORAL | Status: DC

## 2021-03-22 MED ORDER — MIDAZOLAM HCL 5 MG/5ML IJ SOLN
INTRAMUSCULAR | Status: DC | PRN
  Administered 2021-03-22: 2 mg via INTRAVENOUS

## 2021-03-22 MED ORDER — ORAL CARE MOUTH RINSE: 15.0000 mL | Freq: Once | OROMUCOSAL | Status: AC

## 2021-03-22 MED ORDER — BUPIVACAINE LIPOSOME 1.3 % IJ SUSP
INTRAMUSCULAR | Status: DC | PRN
  Administered 2021-03-22: 20 mL

## 2021-03-22 MED ORDER — KETAMINE HCL 10 MG/ML IJ SOLN
INTRAMUSCULAR | Status: AC
  Filled 2021-03-22: qty 1

## 2021-03-22 MED ORDER — ENSURE PRE-SURGERY PO LIQD
296.0000 mL | Freq: Once | ORAL | Status: DC
  Filled 2021-03-22: qty 296

## 2021-03-22 MED ORDER — ALVIMOPAN 12 MG PO CAPS
12.0000 mg | ORAL_CAPSULE | ORAL | Status: AC
  Administered 2021-03-22: 12 mg via ORAL
  Filled 2021-03-22: qty 1

## 2021-03-22 MED ORDER — LIP MEDEX EX OINT
TOPICAL_OINTMENT | CUTANEOUS | Status: AC
  Filled 2021-03-22: qty 7

## 2021-03-22 MED ORDER — DEXAMETHASONE SODIUM PHOSPHATE 10 MG/ML IJ SOLN
INTRAMUSCULAR | Status: AC
  Filled 2021-03-22: qty 1

## 2021-03-22 MED ORDER — TRAMADOL HCL 50 MG PO TABS
50.0000 mg | ORAL_TABLET | Freq: Four times a day (QID) | ORAL | Status: DC | PRN
Start: 2021-03-22 — End: 2021-03-25
  Administered 2021-03-22 – 2021-03-24 (×4): 50 mg via ORAL
  Filled 2021-03-22 (×4): qty 1

## 2021-03-22 MED ORDER — HEPARIN SODIUM (PORCINE) 5000 UNIT/ML IJ SOLN
5000.0000 [IU] | Freq: Once | INTRAMUSCULAR | Status: AC
  Administered 2021-03-22: 5000 [IU] via SUBCUTANEOUS
  Filled 2021-03-22: qty 1

## 2021-03-22 MED ORDER — KETAMINE HCL 10 MG/ML IJ SOLN
INTRAMUSCULAR | Status: DC | PRN
  Administered 2021-03-22: 20 mg via INTRAVENOUS
  Administered 2021-03-22: 30 mg via INTRAVENOUS

## 2021-03-22 MED ORDER — PHENYLEPHRINE HCL-NACL 10-0.9 MG/250ML-% IV SOLN
INTRAVENOUS | Status: AC
  Filled 2021-03-22: qty 250

## 2021-03-22 MED ORDER — FLUTICASONE PROPIONATE 50 MCG/ACT NA SUSP
1.0000 | Freq: Every day | NASAL | Status: DC
  Administered 2021-03-23 – 2021-03-24 (×2): 1 via NASAL
  Filled 2021-03-22 (×2): qty 16

## 2021-03-22 MED ORDER — MIDAZOLAM HCL 2 MG/2ML IJ SOLN
INTRAMUSCULAR | Status: AC
  Filled 2021-03-22: qty 2

## 2021-03-22 MED ORDER — ENSURE PRE-SURGERY PO LIQD
592.0000 mL | Freq: Once | ORAL | Status: DC
  Filled 2021-03-22: qty 592

## 2021-03-22 MED ORDER — EPHEDRINE 5 MG/ML INJ
INTRAVENOUS | Status: AC
  Filled 2021-03-22: qty 10

## 2021-03-22 MED ORDER — LACTATED RINGERS IR SOLN
Status: DC | PRN
  Administered 2021-03-22: 1000 mL

## 2021-03-22 MED ORDER — ALVIMOPAN 12 MG PO CAPS
12.0000 mg | ORAL_CAPSULE | Freq: Two times a day (BID) | ORAL | Status: DC
  Administered 2021-03-23: 12 mg via ORAL
  Filled 2021-03-22: qty 1

## 2021-03-22 MED ORDER — POLYETHYLENE GLYCOL 3350 17 GM/SCOOP PO POWD: 1.0000 | Freq: Once | ORAL | Status: DC

## 2021-03-22 MED ORDER — BUPIVACAINE-EPINEPHRINE (PF) 0.25% -1:200000 IJ SOLN
INTRAMUSCULAR | Status: DC | PRN
  Administered 2021-03-22: 30 mL via PERINEURAL

## 2021-03-22 MED ORDER — HYDROMORPHONE HCL 2 MG/ML IJ SOLN
INTRAMUSCULAR | Status: AC
  Filled 2021-03-22: qty 1

## 2021-03-22 MED ORDER — ACETAMINOPHEN 10 MG/ML IV SOLN
1000.0000 mg | Freq: Once | INTRAVENOUS | Status: AC
  Administered 2021-03-22: 1000 mg via INTRAVENOUS

## 2021-03-22 MED ORDER — FENTANYL CITRATE (PF) 250 MCG/5ML IJ SOLN
INTRAMUSCULAR | Status: AC
  Filled 2021-03-22: qty 5

## 2021-03-22 SURGICAL SUPPLY — 134 items
ADAPTER GOLDBERG URETERAL (ADAPTER) ×1 IMPLANT
ADPR CATH 15X14FR FL DRN BG (ADAPTER) ×3
APL PRP STRL LF DISP 70% ISPRP (MISCELLANEOUS) ×3
APPLIER CLIP 5 13 M/L LIGAMAX5 (MISCELLANEOUS)
APPLIER CLIP ROT 10 11.4 M/L (STAPLE)
APR CLP MED LRG 11.4X10 (STAPLE)
APR CLP MED LRG 5 ANG JAW (MISCELLANEOUS)
BAG URO CATCHER STRL LF (MISCELLANEOUS) ×4 IMPLANT
BLADE EXTENDED COATED 6.5IN (ELECTRODE) ×4 IMPLANT
CANNULA REDUC XI 12-8 STAPL (CANNULA) ×4
CANNULA REDUCER 12-8 DVNC XI (CANNULA) ×3 IMPLANT
CATH INTERMIT  6FR 70CM (CATHETERS) ×1 IMPLANT
CATH URET 5FR 28IN OPEN ENDED (CATHETERS) ×1 IMPLANT
CELLS DAT CNTRL 66122 CELL SVR (MISCELLANEOUS) IMPLANT
CHLORAPREP W/TINT 26 (MISCELLANEOUS) ×4 IMPLANT
CLIP APPLIE 5 13 M/L LIGAMAX5 (MISCELLANEOUS) IMPLANT
CLIP APPLIE ROT 10 11.4 M/L (STAPLE) IMPLANT
CLIP VESOLOCK LG 6/CT PURPLE (CLIP) IMPLANT
CLIP VESOLOCK MED LG 6/CT (CLIP) IMPLANT
CLOTH BEACON ORANGE TIMEOUT ST (SAFETY) ×4 IMPLANT
COVER SURGICAL LIGHT HANDLE (MISCELLANEOUS) ×8 IMPLANT
COVER TIP SHEARS 8 DVNC (MISCELLANEOUS) ×3 IMPLANT
COVER TIP SHEARS 8MM DA VINCI (MISCELLANEOUS) ×4
COVER WAND RF STERILE (DRAPES) IMPLANT
DECANTER SPIKE VIAL GLASS SM (MISCELLANEOUS) ×4 IMPLANT
DEVICE TROCAR PUNCTURE CLOSURE (ENDOMECHANICALS) IMPLANT
DRAIN CHANNEL 19F RND (DRAIN) ×4 IMPLANT
DRAPE ARM DVNC X/XI (DISPOSABLE) ×12 IMPLANT
DRAPE COLUMN DVNC XI (DISPOSABLE) ×3 IMPLANT
DRAPE DA VINCI XI ARM (DISPOSABLE) ×16
DRAPE DA VINCI XI COLUMN (DISPOSABLE) ×4
DRAPE SURG IRRIG POUCH 19X23 (DRAPES) ×4 IMPLANT
DRSG OPSITE POSTOP 3X4 (GAUZE/BANDAGES/DRESSINGS) ×1 IMPLANT
DRSG OPSITE POSTOP 4X10 (GAUZE/BANDAGES/DRESSINGS) IMPLANT
DRSG OPSITE POSTOP 4X6 (GAUZE/BANDAGES/DRESSINGS) IMPLANT
DRSG OPSITE POSTOP 4X8 (GAUZE/BANDAGES/DRESSINGS) IMPLANT
DRSG TEGADERM 2-3/8X2-3/4 SM (GAUZE/BANDAGES/DRESSINGS) ×20 IMPLANT
DRSG TEGADERM 4X4.75 (GAUZE/BANDAGES/DRESSINGS) ×4 IMPLANT
ELECT REM PT RETURN 15FT ADLT (MISCELLANEOUS) ×4 IMPLANT
ENDOLOOP SUT PDS II  0 18 (SUTURE)
ENDOLOOP SUT PDS II 0 18 (SUTURE) IMPLANT
EVACUATOR SILICONE 100CC (DRAIN) ×4 IMPLANT
GAUZE SPONGE 2X2 8PLY STRL LF (GAUZE/BANDAGES/DRESSINGS) ×3 IMPLANT
GAUZE SPONGE 4X4 12PLY STRL (GAUZE/BANDAGES/DRESSINGS) IMPLANT
GLOVE SURG ENC MOIS LTX SZ7.5 (GLOVE) ×12 IMPLANT
GLOVE SURG ENC TEXT LTX SZ7.5 (GLOVE) ×4 IMPLANT
GLOVE SURG UNDER LTX SZ8 (GLOVE) ×12 IMPLANT
GOWN STRL REUS W/TWL XL LVL3 (GOWN DISPOSABLE) ×28 IMPLANT
GRASPER SUT TROCAR 14GX15 (MISCELLANEOUS) IMPLANT
HOLDER FOLEY CATH W/STRAP (MISCELLANEOUS) ×4 IMPLANT
KIT PROCEDURE DA VINCI SI (MISCELLANEOUS)
KIT PROCEDURE DVNC SI (MISCELLANEOUS) IMPLANT
KIT TURNOVER KIT A (KITS) ×8 IMPLANT
LIGASURE IMPACT 36 18CM CVD LR (INSTRUMENTS) ×1 IMPLANT
MANIFOLD NEPTUNE II (INSTRUMENTS) ×4 IMPLANT
NDL ASPIRATION 22 (NEEDLE) ×3 IMPLANT
NDL INSUFFLATION 14GA 120MM (NEEDLE) ×3 IMPLANT
NDL SAFETY ECLIPSE 18X1.5 (NEEDLE) IMPLANT
NEEDLE ASPIRATION 22 (NEEDLE) ×4 IMPLANT
NEEDLE HYPO 18GX1.5 SHARP (NEEDLE)
NEEDLE INSUFFLATION 14GA 120MM (NEEDLE) ×4 IMPLANT
PACK CARDIOVASCULAR III (CUSTOM PROCEDURE TRAY) ×4 IMPLANT
PACK COLON (CUSTOM PROCEDURE TRAY) ×4 IMPLANT
PACK CYSTO (CUSTOM PROCEDURE TRAY) ×4 IMPLANT
PAD POSITIONING PINK XL (MISCELLANEOUS) ×4 IMPLANT
PENCIL SMOKE EVACUATOR (MISCELLANEOUS) IMPLANT
PORT LAP GEL ALEXIS MED 5-9CM (MISCELLANEOUS) ×4 IMPLANT
PROTECTOR NERVE ULNAR (MISCELLANEOUS) ×8 IMPLANT
RELOAD STAPLE 45 3.5 BLU DVNC (STAPLE) IMPLANT
RELOAD STAPLE 45 4.3 GRN DVNC (STAPLE) IMPLANT
RELOAD STAPLE 60 3.5 BLU DVNC (STAPLE) IMPLANT
RELOAD STAPLE 60 4.3 GRN DVNC (STAPLE) IMPLANT
RELOAD STAPLER 3.5X45 BLU DVNC (STAPLE) IMPLANT
RELOAD STAPLER 3.5X60 BLU DVNC (STAPLE) IMPLANT
RELOAD STAPLER 4.3X45 GRN DVNC (STAPLE) IMPLANT
RELOAD STAPLER 4.3X60 GRN DVNC (STAPLE) IMPLANT
RETRACTOR WND ALEXIS 18 MED (MISCELLANEOUS) IMPLANT
RTRCTR WOUND ALEXIS 18CM MED (MISCELLANEOUS)
SCISSORS LAP 5X35 DISP (ENDOMECHANICALS) IMPLANT
SEAL CANN UNIV 5-8 DVNC XI (MISCELLANEOUS) ×12 IMPLANT
SEAL XI 5MM-8MM UNIVERSAL (MISCELLANEOUS) ×16
SEALER VESSEL DA VINCI XI (MISCELLANEOUS) ×4
SEALER VESSEL EXT DVNC XI (MISCELLANEOUS) ×3 IMPLANT
SET IRRIG TUBING LAPAROSCOPIC (IRRIGATION / IRRIGATOR) ×4 IMPLANT
SLEEVE ADV FIXATION 5X100MM (TROCAR) IMPLANT
SOLUTION ELECTROLUBE (MISCELLANEOUS) ×4 IMPLANT
SPONGE GAUZE 2X2 STER 10/PKG (GAUZE/BANDAGES/DRESSINGS) ×1
STAPLER CANNULA SEAL DVNC XI (STAPLE) ×3 IMPLANT
STAPLER CANNULA SEAL XI (STAPLE) ×4
STAPLER CVD CUT GN 40 RELOAD (ENDOMECHANICALS) ×4 IMPLANT
STAPLER CVD CUT GRN 40 RELOAD (ENDOMECHANICALS) IMPLANT
STAPLER ECHELON POWER CIR 29 (STAPLE) ×1 IMPLANT
STAPLER ECHELON POWER CIR 31 (STAPLE) IMPLANT
STAPLER RELOAD 3.5X45 BLU DVNC (STAPLE)
STAPLER RELOAD 3.5X45 BLUE (STAPLE)
STAPLER RELOAD 3.5X60 BLU DVNC (STAPLE)
STAPLER RELOAD 3.5X60 BLUE (STAPLE)
STAPLER RELOAD 4.3X45 GREEN (STAPLE)
STAPLER RELOAD 4.3X45 GRN DVNC (STAPLE)
STAPLER RELOAD 4.3X60 GREEN (STAPLE)
STAPLER RELOAD 4.3X60 GRN DVNC (STAPLE)
STAPLER SHEATH (SHEATH) ×4
STAPLER SHEATH ENDOWRIST DVNC (SHEATH) ×3 IMPLANT
STOPCOCK 4 WAY LG BORE MALE ST (IV SETS) ×8 IMPLANT
SURGILUBE 2OZ TUBE FLIPTOP (MISCELLANEOUS) ×4 IMPLANT
SUT MNCRL AB 4-0 PS2 18 (SUTURE) ×4 IMPLANT
SUT PDS AB 1 CT1 27 (SUTURE) IMPLANT
SUT PDS AB 1 TP1 96 (SUTURE) IMPLANT
SUT PROLENE 0 CT 2 (SUTURE) IMPLANT
SUT PROLENE 2 0 KS (SUTURE) ×4 IMPLANT
SUT PROLENE 2 0 SH DA (SUTURE) IMPLANT
SUT SILK 2 0 (SUTURE)
SUT SILK 2 0 SH CR/8 (SUTURE) IMPLANT
SUT SILK 2-0 18XBRD TIE 12 (SUTURE) IMPLANT
SUT SILK 3 0 (SUTURE) ×4
SUT SILK 3 0 SH CR/8 (SUTURE) ×4 IMPLANT
SUT SILK 3-0 18XBRD TIE 12 (SUTURE) ×3 IMPLANT
SUT V-LOC BARB 180 2/0GR6 GS22 (SUTURE)
SUT VIC AB 3-0 SH 18 (SUTURE) IMPLANT
SUT VIC AB 3-0 SH 27 (SUTURE)
SUT VIC AB 3-0 SH 27XBRD (SUTURE) IMPLANT
SUT VICRYL 0 UR6 27IN ABS (SUTURE) ×4 IMPLANT
SUTURE V-LC BRB 180 2/0GR6GS22 (SUTURE) IMPLANT
SYR 10ML LL (SYRINGE) ×4 IMPLANT
SYR CONTROL 10ML LL (SYRINGE) IMPLANT
SYS LAPSCP GELPORT 120MM (MISCELLANEOUS)
SYSTEM LAPSCP GELPORT 120MM (MISCELLANEOUS) IMPLANT
TAPE UMBILICAL 1/8 X36 TWILL (MISCELLANEOUS) ×4 IMPLANT
TOWEL OR NON WOVEN STRL DISP B (DISPOSABLE) ×4 IMPLANT
TRAY FOLEY MTR SLVR 16FR STAT (SET/KITS/TRAYS/PACK) ×4 IMPLANT
TROCAR ADV FIXATION 5X100MM (TROCAR) ×4 IMPLANT
TUBING CONNECTING 10 (TUBING) ×8 IMPLANT
TUBING INSUFFLATION 10FT LAP (TUBING) ×4 IMPLANT
WATER STERILE IRR 3000ML UROMA (IV SOLUTION) ×4 IMPLANT

## 2021-03-22 NOTE — Anesthesia Postprocedure Evaluation (Signed)
Anesthesia Post Note  Patient: Jessica Mcdonald  Procedure(s) Performed: XI ROBOTIC ASSISTED LOWER ANTERIOR RESECTION TAKEDOWN OF COLOVAGINAL  FISTULA  AND REPAIR AND BILATERAL TAP BLOCKS (N/A Abdomen) FLEXIBLE SIGMOIDOSCOPY (N/A ) CYSTOSCOPY with FIREFLY INJECTION AND OPEN ENDED STENT PLACEMENT (Bilateral Bladder)     Patient location during evaluation: PACU Anesthesia Type: General Level of consciousness: awake and alert Pain management: pain level controlled Vital Signs Assessment: post-procedure vital signs reviewed and stable Respiratory status: spontaneous breathing, nonlabored ventilation, respiratory function stable and patient connected to nasal cannula oxygen Cardiovascular status: blood pressure returned to baseline and stable Postop Assessment: no apparent nausea or vomiting Anesthetic complications: no   No complications documented.  Last Vitals:  Vitals:   03/22/21 1415 03/22/21 1440  BP: (!) 143/67 (!) 142/61  Pulse: 65 61  Resp: 15 18  Temp:  (!) 36.4 C  SpO2: 96% 98%    Last Pain:  Vitals:   03/22/21 1454  TempSrc:   PainSc: 4                  Gerrell Tabet L Shawnee Higham

## 2021-03-22 NOTE — Transfer of Care (Signed)
Immediate Anesthesia Transfer of Care Note  Patient: Jessica Mcdonald  Procedure(s) Performed: XI ROBOTIC ASSISTED LOWER ANTERIOR RESECTION TAKEDOWN OF COLOVAGINAL  FISTULA  AND REPAIR AND BILATERAL TAP BLOCKS (N/A Abdomen) FLEXIBLE SIGMOIDOSCOPY (N/A ) CYSTOSCOPY with FIREFLY INJECTION AND OPEN ENDED STENT PLACEMENT (Bilateral Bladder)  Patient Location: PACU  Anesthesia Type:General  Level of Consciousness: awake, alert  and oriented  Airway & Oxygen Therapy: Patient Spontanous Breathing and Patient connected to face mask  Post-op Assessment: Report given to RN and Post -op Vital signs reviewed and stable  Post vital signs: Reviewed and stable  Last Vitals:  Vitals Value Taken Time  BP 128/98 03/22/21 1300  Temp    Pulse 67 03/22/21 1304  Resp 12 03/22/21 1304  SpO2 100 % 03/22/21 1304  Vitals shown include unvalidated device data.  Last Pain:  Vitals:   03/22/21 0721  TempSrc: Oral  PainSc: 0-No pain         Complications: No complications documented.

## 2021-03-22 NOTE — Consult Note (Signed)
Urology Consult   Physician requesting consult: Nadeen Landau, MD  Reason for consult: Colovaginal fistula  History of Present Illness: Jessica Mcdonald is a 85 y.o. female who is undergoing a robotic colovaginal fistula repair with Dr. Dema Severin.  Urology has been consulted to perform cystoscopy with firefly ureteral injections to aid in ureteral identification.  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  CT from 09/20/2020 did show a segment of inflamed colon in the pelvic region potentially involving the vaginal cuff, which is likely the culprit for her current issue.  She has a significant past urologic history of a mid urethral sling.    Past Medical History:  Diagnosis Date  . Anorgasmia of female   . Cancer ([place]) 1998   Left breast Lumpectomy, Chemo,Rad  . Cataract 2010 2011  . DDD (degenerative disc disease)   . GERD (gastroesophageal reflux disease) 2000  . Osteoporosis     Past Surgical History:  Procedure Laterality Date  . ABDOMINAL HYSTERECTOMY  2019   with sling  . BREAST SURGERY  3/98   lumpectomy (left) with nodes  . BUNIONECTOMY Bilateral 2010  . CATARACT EXTRACTION Bilateral 2010 2011  . FLEXIBLE SIGMOIDOSCOPY N/A 12/29/2020   Procedure: FLEXIBLE SIGMOIDOSCOPY with propofol;  Surgeon: Otis Brace, MD;  Location: [facility];  Service: Gastroenterology;  Laterality: N/A;  . MOHS SURGERY  08/2009   nose, squamous cell ca  . SYNOVIAL CYST EXCISION     lumbar    Current Hospital Medications:  Home Meds:  Current Meds  Medication Sig  . Azelaic Acid 15 % cream Apply 1 application topically 2 (two) times daily.  Marland Kitchen CALCIUM CITRATE-VITAMIN D PO Take 2 tablets by mouth 2 (two) times daily. 1200 mg / 1000 mg slow released  . cephALEXin (KEFLEX) 250 MG capsule Take 250 mg by mouth daily.  Marland Kitchen estradiol (ESTRACE) 0.1 MG/GM vaginal cream Place 1 Applicatorful vaginally 2 (two) times a week.  .  fluticasone (FLONASE) 50 MCG/ACT nasal spray Place 1 spray into both nostrils at bedtime.  Marland Kitchen ipratropium (ATROVENT) 0.06 % nasal spray Place 1-2 sprays into both nostrils daily.  . Magnesium 500 MG TABS Take 500 mg by mouth every other day.  . Multiple Vitamins-Minerals (ALIVE MULTI-VITAMIN PO) Take 1 tablet by mouth daily.  . Omega-3 Fatty Acids (FISH OIL) 1000 MG CAPS Take 1,400 mg by mouth See admin instructions. Take 1000 mg 4 times weekly  . saccharomyces boulardii (FLORASTOR) 250 MG capsule Take 250 mg by mouth 3 (three) times a week.  . TURMERIC PO Take 1,000 mg by mouth daily.  . vitamin B-12 (CYANOCOBALAMIN) 1000 MCG tablet Take 1,000 mcg by mouth daily.    Scheduled Meds: . bupivacaine liposome  20 mL Infiltration Once  . Chlorhexidine Gluconate Cloth  6 each Topical Once   And  . Chlorhexidine Gluconate Cloth  6 each Topical Once  . [START ON 03/23/2021] feeding supplement  296 mL Oral Once  . feeding supplement  592 mL Oral Once   Continuous Infusions: . cefoTEtan (CEFOTAN) IV    . lactated ringers 10 mL/hr at 03/22/21 0730  . lactated ringers 10 mL/hr at 03/22/21 0748  . phenylephrine     PRN Meds:.  Allergies: No Known Allergies  Family History  Problem Relation Age of Onset  . Osteoporosis Mother   . Heart disease Father     Social History:  reports that she quit smoking about 57 years ago. She  has never used smokeless tobacco. She reports current alcohol use of about 10.0 standard drinks of alcohol per week. She reports that she does not use drugs.  ROS: A complete review of systems was performed.  All systems are negative except for pertinent findings as noted.  Physical Exam:  Vital signs in last 24 hours: Temp:  [98.4 F (36.9 C)] 98.4 F (36.9 C) (05/04 0721) Pulse Rate:  [69] 69 (05/04 0721) Resp:  [16] 16 (05/04 0721) BP: (149)/(80) 149/80 (05/04 0721) SpO2:  [97 %] 97 % (05/04 0721) Weight:  [55.8 kg] 55.8 kg (05/04 0721) Constitutional:  Alert  and oriented, No acute distress Cardiovascular: Regular rate and rhythm, No JVD Respiratory: Normal respiratory effort, Lungs clear bilaterally GI: Abdomen is soft, nontender, nondistended, no abdominal masses GU: No CVA tenderness Lymphatic: No lymphadenopathy Neurologic: Grossly intact, no focal deficits Psychiatric: Normal mood and affect  Laboratory Data:  No results for input(s): WBC, HGB, HCT, PLT in the last 72 hours.  No results for input(s): NA, K, CL, GLUCOSE, BUN, CALCIUM, CREATININE in the last 72 hours.  Invalid input(s): CO3   No results found for this or any previous visit (from the past 24 hour(s)). Recent Results (from the past 240 hour(s))  SARS CORONAVIRUS 2 (TAT 6-24 HRS) Nasopharyngeal Nasopharyngeal Swab     Status: None   Collection Time: 03/20/21  9:49 AM   Specimen: Nasopharyngeal Swab  Result Value Ref Range Status   SARS Coronavirus 2 NEGATIVE NEGATIVE Final    Comment: (NOTE) SARS-CoV-2 target nucleic acids are NOT DETECTED.  The SARS-CoV-2 RNA is generally detectable in upper and lower respiratory specimens during the acute phase of infection. Negative results do not preclude SARS-CoV-2 infection, do not rule out co-infections with other pathogens, and should not be used as the sole basis for treatment or other patient management decisions. Negative results must be combined with clinical observations, patient history, and epidemiological information. The expected result is Negative.  Fact Sheet for Patients: SugarRoll.be  Fact Sheet for Healthcare Providers: https://www.woods-mathews.com/  This test is not yet approved or cleared by the [place] FDA and  has been authorized for detection and/or diagnosis of SARS-CoV-2 by FDA under an Emergency Use Authorization (EUA). This EUA will remain  in effect (meaning this test can be used) for the duration of the COVID-19 declaration under Se ction  564(b)(1) of the Act, 21 U.S.C. section 360bbb-3(b)(1), unless the authorization is terminated or revoked sooner.  Performed at [facility], [place] [address], [place], [place] [address]     Renal Function: Recent Labs    03/16/21 1433  CREATININE 0.70   Estimated Creatinine Clearance: 44.1 mL/min (by C-G formula based on SCr of 0.7 mg/dL).  Radiologic Imaging: No results found.  I independently reviewed the above imaging studies.  Impression/Recommendation 85 year old female with a colovaginal fistula undergoing robotic colovaginal fistula pair with Dr. Dema Severin  The risk, benefits and alternatives of cystoscopy with bilateral ureteral firefly injections was discussed with the patient.  She voices understanding and wishes to proceed.  Ellison Hughs, MD [facility] 03/22/2021, 8:19 AM

## 2021-03-22 NOTE — Op Note (Addendum)
PATIENT: Jessica Mcdonald  85 y.o. female  Patient Care Team: Lavone Orn, MD as PCP - General (Internal Medicine)  PREOP DIAGNOSIS: Colovaginal fistula, history of diverticulitis  POSTOP DIAGNOSIS: Same  PROCEDURE:  1. Robotic low anterior resection with double stapled colorectal anastomosis 2. Takedown of colovaginal fistula 3. Repair of vagina (where fistula was located) 4. Flexible sigmoidoscopy 5. Bilateral transversus abdominus plane blocks  SURGEON: Sharon Mt. Brylee Mcgreal, MD  ASSISTANT: Louanna Raw, MD  ANESTHESIA: General endotracheal  EBL: 75 mL Total I/O In: 1000 [I.V.:1000] Out: 475 [Urine:400; Blood:75]  DRAINS: None  SPECIMEN: Rectosigmoid colon - suture on proximal end, staple on distal end  COUNTS: Sponge, needle and instrument counts were reported correct x2  FINDINGS:  Diseased appearing sigmoid colon with large redundant loop in pelvis. Mid sigmoid was densely adherent to the apex of the vagina on the left side. This was taken down revealing a colo-vaginal fistula with stool in tract.  Healthy tissue was encountered at the level of the proximal rectum.  This was the chosen point of distal transection.  The diseased segment of colon was removed.  A 29 mm EEA colorectal anastomosis fashioned 13 cm from the anal verge by flexible sigmoidoscopy.  This is tension-free, well perfused, airtight, and hemostatic by flexible sigmoidoscopy.  This is just above the second valve of [place].  NARRATIVE: Informed consent was verified. She was taken to the operating room, placed supine on the operating table and SCD's were applied. General endotracheal anesthesia was induced without difficulty.  She was then positioned in the lithotomy position with Allen stirrups.  Arms were carefully tucked.  Pressure points were then evaluated and padded.  The abdomen and pelvis were prepped out for the urologic portion of the procedure.  Antibiotics were administered.  Urology scrubbed  for their portion of the procedure.  Please refer to Dr. Jackson Latino note for details.  The abdomen was then prepped and draped in the standard sterile fashion for our portion of the procedure. Surgical timeout was called indicating the correct patient, procedure, positioning and need for preoperative antibiotics.   An OG tube was placed by anesthesia and confirmed to be to suction.  At Palmer's point, a stab incision was created and the Veress needle was introduced into the peritoneal cavity on the first attempt.  Intraperitoneal location was confirmed by the aspiration and saline drop test.  Pneumoperitoneum was established to a maximum pressure of 15 mmHg using CO2.  Following this, the abdomen was marked for planned trocar sites.  Just to the right and cephalad to the umbilicus, an 8 mm incision was created and an 8 mm blunt tipped robotic trocar was cautiously placed into the peritoneal cavity.  The laparoscope was inserted and demonstrated no evidence of trocar site nor Veress needle site complications.  The Veress needle was removed.  Bilateral transversus abdominis plane blocks were then created using a dilute mixture of Exparel with Marcaine.  3 additional 8 mm robotic trochars were placed under direct visualization roughly in a line extending from the right ASIS towards the left upper quadrant. The bladder was inspected and noted to be at/below the pubic symphysis.  An additional 5 mm assist port was placed in the right lateral abdomen under direct visualization.  The abdomen was surveyed without any apparent obvious pathology in the upper abdomen. She was positioned in Trendelenburg with some left side up.  Small bowel was carefully retracted out of the pelvis.  The robot was then docked and I  went to the console.   The sigmoid colon was readily identified.    There is dense adherence of this to her left pelvic sidewall.  This tracked down to the level of the presumed vaginal cuff.  There is a loop of  sigmoid stuck within the pelvis as well.  Given the degree of disease laterally and her inability to retract the sigmoid out of the pelvis without performing adhesiolysis, a lateral to medial approach was employed.  The sigmoid colon was retracted to the right.  The intersigmoid fossa was identified and opened.  The gonadal vessels were identified working medially.  The left ureter is readily identified and confirmed with both ICG and direct visualization.  The associated mesocolon was reflected medially off the retroperitoneum staying above the left ureter and gonadal vessels.  Dense adhesions were present in the left pelvic sidewall.  The left ureter was identified and traced out distally.  The sigmoid colon is then carefully taken down from the vaginal cuff sharply using scissors.  There is an obvious fistulous tract to the vagina at this level.  We are able to protect the remainder of her vaginal cuff free of any injury.  There is a large loop of sigmoid within the pelvis and adhesions from the sigmoid are carefully freed sharply.  Ultimately, were able to get this loop of sigmoid out of the pelvis.  There is a hairpin turn at this location with some thickened mesentery at this level as well.  The right ureter is identified.  The peritoneum overlying the sigmoid colon is scored.  Due to the size of this prior phlegmon, elevation of the sigmoid colon was quite challenging.  We therefore elected to perform this portion of the procedure through a Pfannenstiel incision.  Prior to this, the sigmoid colon and descending colon were mobilized laterally by incising the Johan Antonacci line of Toldt all the way up to the level of splenic flexure.  The entire mesocolon associated this segment of colon is mobilized medially.  There is more than adequate reach of her descending colon into her deep pelvis without any tension.  The robot was then undocked and we turned our attention to the extracorporeal portion of the procedure.  I  scrubbed back in.  A Pfannenstiel incision was created approximately 3 fingerbreadths above the pubic symphysis.  The subcutaneous tissue was incised.  The fascia is incised transversely over the rectus muscle.  The peritoneum was opened longitudinally staying above the bladder.  An Gonzalez wound protector was placed.  The sigmoid colon was delivered.  Her planned point of proximal transection is identified on the segment where the colon is healthy and supple in appearance well above the diseased segment.  A window was created the mesentery and a pursestring device applied.  A 2-0 Prolene on a Keith needle was passed.  The colon was divided to level.  Belt loops consisting of 3-0 silk were placed around the pursestring line.  EEA sizers were used and a 29 mm EEA stapler selected.  The anvil was placed in the pursestring tied.  A small amount of fat was cleared from our planned anastomosis.  Attention was then turned to the distal portion of the dissection.  Again confirming location of both the right and left ureter, the sigmoid colon was elevated.  The mesentery was ligated using a LigaSure device 'hugging' the sigmoid colon.  The distal point of transection was identified and at this level, it is clear that were on the proximal  rectum.  Anything more proximal to this is not healthy for an anastomosis.  A window was created the mesentery at the level the proximal rectum.  A contoured green load stapler was applied.  This is closed, held, fired.  The staple line is inspected and noted to be complete.  The LigaSure was used to divide the remaining attachments of the mesentery at this level.  The rectal stump appears well perfused in appearance.  The vagina is inspected.  This is well away from our planned anastomotic staple line.  There is a hole present within the vagina where the fistula was located.  The edges of this tissue are sharply cleaned to have healthy margins.  The hole is then closed in two layers  using 2-0 V-Loc sutures. The defect is approximately 1 x 1 cm.  The anvil is able to reach into the deep pelvis and remained in that location without any tension.  There is a palpable pulse in the mesentery going out to the anvil.  I then went below.  The vagina is inspected using the sigmoidoscope to confirm adequacy of our repair.  This is airtight.  I then checked the rectal stump.  The sigmoidoscope was inserted through the anus under direct visualization.  The rectal stump was healthy in appearance.  With the pelvis full of water, both the vagina and the rectal stump were airtight.  EEA sizers were then cautiously passed up the rectal stump.  The 20 mm stapler was then passed under direct visualization.  The spike is deployed just anterior to the staple line.  The anvil and spike were mated.  The vagina is ensured to have been retracted out of our planned anastomosis.  Orientation is confirmed such that there is no twisting of the colon or bowel underneath the cut edge of the mesentery.  The stapler was then closed, held, fired.  The stapler was removed and the donuts were inspected.  These are complete.  The anastomosis is under no tension.  The pelvis is filled again with sterile saline.  The colon proximally anastomosis is gently occluded.  The flexible sigmoidoscope was passed and with good distention of the colon the anastomosis is airtight.  The anastomosis is hemostatic in appearance endoscopically.  The sigmoidoscope was then withdrawn.  Anastomosis is 13 cm in the anal verge by flexible sigmoidoscopy.  The bladder was backfilled with sterile saline.  With more than adequate distention of the bladder, there is no apparent injury or hole in the bladder.  No portion of the sigmoid colon appeared adherent to the bladder either.  The Foley was placed back to gravity drainage.   Irrigation was evacuated from the pelvis.  The abdomen and pelvis are surveyed and noted to be completely hemostatic without  any apparent injury.  Under direct visualization, all trochars are removed.  The Windsor wound protector was removed.  Gowns/gloves are changed and a fresh set of clean instruments utilized. Additional sterile drapes were placed around the field.   Sponge, needle, and instrument counts were reported correct. Omentum is brought down into the pelvis between the colon/rectum and the vagina. The Pfannenstiel peritoneum was closed with a running 0 Vicryl suture.  The rectus fascia was then closed using 2 running #1 PDS sutures.  The fascia was then palpated and noted to be completely closed.  Additional anesthetic was infiltrated at the Pfannenstiel site.  Sponge, needle, and instrument counts were then again reported correct. 4-0 Monocryl subcuticular suture was used to  close the skin of all incision sites.  Dermabond was placed over all incisions.  A honeycomb dressing placed over the Pfannenstiel as well.   She is taken out of lithotomy, awakened from anesthesia, extubated, and transferred to a stretcher for transport to PACU in satisfactory condition having tolerated the procedure well.

## 2021-03-22 NOTE — TOC Initial Note (Addendum)
Transition of Care [facility]) - Initial/Assessment Note    Patient Details  Name: Jessica Mcdonald MRN: [id number] Date of Birth: [birth year]  Transition of Care [facility]) CM/SW Contact:    Dessa Phi, RN Phone Number: 03/22/2021, 3:16 PM  Clinical Narrative: From [facility]ae Dry following-agree to [facility] rehab in patient's benefit with [facility]. . S/p takedown colovaginal fistula,cystoscopy,stent. Recc PT cons when appropriate.                Barriers to Discharge: Continued Medical Work up   Patient Goals and CMS Choice CMS Medicare.gov Compare Post Acute Care list provided to:: Patient Represenative (must comment) Dominica Severin spouse [phone]) Choice offered to / list presented to : Spouse  Expected Discharge Plan and Services Expected Discharge Plan: Assisted Living   Discharge Planning Services: CM Consult   Living arrangements for the past 2 months: [place]                                      Prior Living Arrangements/Services Living arrangements for the past 2 months: [place] Lives with:: Facility Resident Patient language and need for interpreter reviewed:: Yes Do you feel safe going back to the place where you live?: Yes      Need for Family Participation in Patient Care: No (Comment) Care giver support system in place?: Yes (comment)   Criminal Activity/Legal Involvement Pertinent to Current Situation/Hospitalization: No - Comment as needed  Activities of Daily Living Home Assistive Devices/Equipment: Hearing aid ADL Screening (condition at time of admission) Patient's cognitive ability adequate to safely complete daily activities?: Yes Is the patient deaf or have difficulty hearing?: Yes Does the patient have difficulty seeing, even when wearing glasses/contacts?: No Does the patient have difficulty concentrating, remembering, or making decisions?: No Patient able to express need for assistance with ADLs?:  Yes Does the patient have difficulty dressing or bathing?: No Independently performs ADLs?: Yes (appropriate for developmental age) Does the patient have difficulty walking or climbing stairs?: No Weakness of Legs: None Weakness of Arms/Hands: None  Permission Sought/Granted Permission sought to share information with : Case Manager Permission granted to share information with : Yes, Verbal Permission Granted  Share Information with NAME: Case manager     Permission granted to share info w Relationship: Dominica Severin spouse [phone]     Emotional Assessment Appearance:: Appears stated age Attitude/Demeanor/Rapport: Gracious Affect (typically observed): Accepting Orientation: : Oriented to Self,Oriented to Place,Oriented to  Time,Oriented to Situation Alcohol / Substance Use: Not Applicable Psych Involvement: No (comment)  Admission diagnosis:  S/P laparoscopic-assisted sigmoidectomy [Z90.49] Patient Active Problem List   Diagnosis Date Noted  . S/P laparoscopic-assisted sigmoidectomy 03/22/2021  . CHRONIC RHINITIS 12/13/2010  . COUGH 07/13/2009  . BREAST CANCER 07/12/2009  . PURE HYPERCHOLESTEROLEMIA 07/12/2009  . MERALGIA PARESTHETICA 07/12/2009  . MACULAR DEGENERATION 07/12/2009  . GERD 07/12/2009  . OSTEOARTHRITIS 07/12/2009  . BACK PAIN, LUMBAR 07/12/2009  . OSTEOPENIA 07/12/2009  . URINARY INCONTINENCE 07/12/2009  . BREAST BIOPSY, BY INCISION, HX OF 07/12/2009   PCP:  Lavone Orn, MD Pharmacy:   [facility], [place] [address] Phone: [phone] Fax: [phone]     Social Determinants of Health (SDOH) Interventions    Readmission Risk Interventions No flowsheet data found.

## 2021-03-22 NOTE — Anesthesia Procedure Notes (Signed)
Procedure Name: Intubation Performed by: Catelynn Sparger A, CRNA Pre-anesthesia Checklist: Patient identified, Emergency Drugs available, Suction available and Patient being monitored Patient Re-evaluated:Patient Re-evaluated prior to induction Oxygen Delivery Method: Circle system utilized Preoxygenation: Pre-oxygenation with 100% oxygen Induction Type: IV induction Ventilation: Mask ventilation without difficulty Laryngoscope Size: Miller and 2 Grade View: Grade I Tube type: Oral Tube size: 7.0 mm Number of attempts: 1 Airway Equipment and Method: Stylet Placement Confirmation: ETT inserted through vocal cords under direct vision,  positive ETCO2 and breath sounds checked- equal and bilateral Secured at: 21 cm Tube secured with: Tape Dental Injury: Teeth and Oropharynx as per pre-operative assessment        

## 2021-03-22 NOTE — H&P (Signed)
CC: Referred for colovaginal fistula, possible colovesical fistula, diverticulitis  HPI: [name] is a very pleasant 30yoF with hx of fibroids who underwent total vaginal hysterectomy in the last 6 months. Recently, she has developed issues with which she believes to be stool draining from her vagina. Of note, she also had a TVT/sling. Her ovaries were left in situ. She reports stool in small volumes coming from her vaginal cuff deep within. She had a speculum exam completed gynecology that did demonstrate suggestions of a possible fistula on the left side of the apex of the vaginal cuff. She'll occasionally report some air that she believes to be coming from the vagina as well as occasional bouts of pneumaturia. She has had some recent urinary tract infections as well. She underwent CT scan of the abdomen and pelvis 09/20/20 that showed a long segment bowel wall thickening with severe diverticulosis in the sigmoid all within her pelvis and findings are interpreted to be possibly subacute diverticulitis. No abscess. 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 Punctate focus of gas in the bladder with some asymmetric wall thickening near her colovaginal fistula querying colovesical fistula. They did do some retrograde filling that did not clearly opacify a colovaginal fistula per se. Currently without any significant abdominal pain or pelvic pain. No fever or chills.  She underwent flexible sigmoidoscopy 12/29/20 which showed diverticulosis and some peridiverticular erythema. The prep quality was not great. She has ongoing issues with some feculent type drainage from her vagina as well as well as recurrent UTIs. She is interested in proceeding with surgery.  INTERVAL HX She denies any changes in her health or health history since we met in the office. She tolerated her bowel prep with reasonable result.  PMH: Fibroids; otherwise denies any prior medical history.  PSH:  TVH/sling; denies any prior abdominal or pelvic surgical hx.  FHx: Denies FHx of colorectal, breast, endometrial, ovarian or cervical cancer.  Social: Denies use of tobacco/EtOH/drugs. Quite independent. She resides at [facility] with her husband. Her only medication is multivitamin.  ROS: A comprehensive 10 system review of systems was completed with the patient and pertinent findings as noted above.  Past Medical History:  Diagnosis Date  . Anorgasmia of female   . Cancer ([place]) 1998   Left breast Lumpectomy, Chemo,Rad  . Cataract 2010 2011  . DDD (degenerative disc disease)   . GERD (gastroesophageal reflux disease) 2000  . Osteoporosis     Past Surgical History:  Procedure Laterality Date  . ABDOMINAL HYSTERECTOMY  2019   with sling  . BREAST SURGERY  3/98   lumpectomy (left) with nodes  . BUNIONECTOMY Bilateral 2010  . CATARACT EXTRACTION Bilateral 2010 2011  . FLEXIBLE SIGMOIDOSCOPY N/A 12/29/2020   Procedure: FLEXIBLE SIGMOIDOSCOPY with propofol;  Surgeon: Otis Brace, MD;  Location: [facility];  Service: Gastroenterology;  Laterality: N/A;  . MOHS SURGERY  08/2009   nose, squamous cell ca  . SYNOVIAL CYST EXCISION     lumbar    Family History  Problem Relation Age of Onset  . Osteoporosis Mother   . Heart disease Father     Social:  reports that she quit smoking about 57 years ago. She has never used smokeless tobacco. She reports current alcohol use of about 10.0 standard drinks of alcohol per week. She reports that she does not use drugs.  Allergies: No Known Allergies  Medications: I have reviewed the patient's current medications.  Results for orders placed or performed during the hospital encounter  of 03/20/21 (from the past 48 hour(s))  SARS CORONAVIRUS 2 (TAT 6-24 HRS) Nasopharyngeal Nasopharyngeal Swab     Status: None   Collection Time: 03/20/21  9:49 AM   Specimen: Nasopharyngeal Swab  Result Value Ref Range   SARS Coronavirus 2 NEGATIVE  NEGATIVE    Comment: (NOTE) SARS-CoV-2 target nucleic acids are NOT DETECTED.  The SARS-CoV-2 RNA is generally detectable in upper and lower respiratory specimens during the acute phase of infection. Negative results do not preclude SARS-CoV-2 infection, do not rule out co-infections with other pathogens, and should not be used as the sole basis for treatment or other patient management decisions. Negative results must be combined with clinical observations, patient history, and epidemiological information. The expected result is Negative.  Fact Sheet for Patients: SugarRoll.be  Fact Sheet for Healthcare Providers: https://www.woods-mathews.com/  This test is not yet approved or cleared by the [place] FDA and  has been authorized for detection and/or diagnosis of SARS-CoV-2 by FDA under an Emergency Use Authorization (EUA). This EUA will remain  in effect (meaning this test can be used) for the duration of the COVID-19 declaration under Se ction 564(b)(1) of the Act, 21 U.S.C. section 360bbb-3(b)(1), unless the authorization is terminated or revoked sooner.  Performed at [facility], [place] [address] [place], [place] [address]     No results found.  ROS - all of the below systems have been reviewed with the patient and positives are indicated with bold text General: chills, fever or night sweats Eyes: blurry vision or double vision ENT: epistaxis or sore throat Allergy/Immunology: itchy/watery eyes or nasal congestion Hematologic/Lymphatic: bleeding problems, blood clots or swollen lymph nodes Endocrine: temperature intolerance or unexpected weight changes Breast: new or changing breast lumps or nipple discharge Resp: cough, shortness of breath, or wheezing CV: chest pain or dyspnea on exertion GI: as per HPI GU: dysuria, trouble voiding, or hematuria MSK: joint pain or joint stiffness Neuro: TIA or stroke  symptoms Derm: pruritus and skin lesion changes Psych: anxiety and depression  PE Blood pressure (!) 149/80, pulse 69, temperature 98.4 F (36.9 C), temperature source Oral, resp. rate 16, height '5\' 3"'  (1.6 m), weight 55.8 kg, last menstrual period 11/20/1987, SpO2 97 %. Constitutional: NAD; conversant; wearing mask Eyes: Moist conjunctiva; no lid lag; anicteric Lungs: Normal respiratory effort CV: RRR; no pitting edema GI: Abd soft, NT/ND; no palpable hepatosplenomegaly Psychiatric: Appropriate affect  Results for orders placed or performed during the hospital encounter of 03/20/21 (from the past 48 hour(s))  SARS CORONAVIRUS 2 (TAT 6-24 HRS) Nasopharyngeal Nasopharyngeal Swab     Status: None   Collection Time: 03/20/21  9:49 AM   Specimen: Nasopharyngeal Swab  Result Value Ref Range   SARS Coronavirus 2 NEGATIVE NEGATIVE    Comment: (NOTE) SARS-CoV-2 target nucleic acids are NOT DETECTED.  The SARS-CoV-2 RNA is generally detectable in upper and lower respiratory specimens during the acute phase of infection. Negative results do not preclude SARS-CoV-2 infection, do not rule out co-infections with other pathogens, and should not be used as the sole basis for treatment or other patient management decisions. Negative results must be combined with clinical observations, patient history, and epidemiological information. The expected result is Negative.  Fact Sheet for Patients: SugarRoll.be  Fact Sheet for Healthcare Providers: https://www.woods-mathews.com/  This test is not yet approved or cleared by the [place] FDA and  has been authorized for detection and/or diagnosis of SARS-CoV-2 by FDA under an Emergency Use Authorization (EUA).  This EUA will remain  in effect (meaning this test can be used) for the duration of the COVID-19 declaration under Se ction 564(b)(1) of the Act, 21 U.S.C. section 360bbb-3(b)(1), unless the  authorization is terminated or revoked sooner.  Performed at [facility], [place] [address], [place], [place] [address]     No results found.   A/P: Ms. 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 The pathophysiology of colovaginal fistulas as well as diverticulitis was discussed at length with associated pictures. -We have again reviewed options for addressing this including observation and surgery - with surgery offering best chance for achieving her desired goals. -We discussed robotic sigmoidectomy with takedown of colovaginal fistula, flexible sigmoidoscopy. Urology at time of surgery for cysto/stents/ICG -The planned procedure, material risks (including, but not limited to, pain, bleeding, infection, scarring, need for blood transfusion, damage to surrounding structures- blood vessels/nerves/viscus/organs, damage to ureter, urine leak, leak from anastomosis, need for additional procedures, worsening of pre-existing medical conditions, need for stoma which in some circumstances can be permanent, hernia, recurrence, DVT/PE, pneumonia, heart attack, stroke, death) benefits and alternatives to surgery were discussed at length. I noted a good probability that the procedure would help improve her symptoms. The patient's questions were answered to her satisfaction, she voiced understanding and elected to proceed with surgery pending colonoscopy. Additionally, we discussed typical postoperative expectations and the recovery process.  Jessica Landau, MD [facility] Use [email or web address] to contact on call provider

## 2021-03-23 ENCOUNTER — Encounter (HOSPITAL_COMMUNITY): Payer: Self-pay | Admitting: Surgery

## 2021-03-23 LAB — BASIC METABOLIC PANEL
Anion gap: 7 (ref 5–15)
BUN: 7 mg/dL — ABNORMAL LOW (ref 8–23)
CO2: 22 mmol/L (ref 22–32)
Calcium: 9.3 mg/dL (ref 8.9–10.3)
Chloride: 103 mmol/L (ref 98–111)
Creatinine, Ser: 0.7 mg/dL (ref 0.44–1.00)
GFR, Estimated: >60 mL/min (ref >60)
Glucose, Bld: 125 mg/dL — ABNORMAL HIGH (ref 70–99)
Potassium: 4.5 mmol/L (ref 3.5–5.1)
Sodium: 132 mmol/L — ABNORMAL LOW (ref 135–145)

## 2021-03-23 LAB — CBC
HCT: 38.3 % (ref 36.0–46.0)
Hemoglobin: 12.5 g/dL (ref 12.0–15.0)
MCH: 30.7 pg (ref 26.0–34.0)
MCHC: 32.6 g/dL (ref 30.0–36.0)
MCV: 94.1 fL (ref 80.0–100.0)
Platelets: 247 10*3/uL (ref 150–400)
RBC: 4.07 MIL/uL (ref 3.87–5.11)
RDW: 14.6 % (ref 11.5–15.5)
WBC: 11.2 10*3/uL — ABNORMAL HIGH (ref 4.0–10.5)
nRBC: 0 % (ref 0.0–0.2)

## 2021-03-23 NOTE — Progress Notes (Signed)
OT Cancellation Note  Patient Details Name: BESSYE STITH MRN: [id number] DOB: 1936-01-31   Cancelled Treatment:    Reason Eval/Treat Not Completed: OT screened, no needs identified, will sign off. Patient ambulating in the room and able to perform all ADLs. Reports she is "sore" but otherwise has no OT needs.  Ashan Cueva L Klein Willcox 03/23/2021, 12:13 PM

## 2021-03-23 NOTE — Progress Notes (Addendum)
Subjective No acute events. Feeling well. Tramadol working great for soreness. No n/v. Tolerating clears without n/v.  Objective: Vital signs in last 24 hours: Temp:  [97.4 F (36.3 C)-98.5 F (36.9 C)] 97.6 F (36.4 C) (05/04 2148) Pulse Rate:  [59-78] 78 (05/04 2148) Resp:  [10-18] 18 (05/04 2148) BP: (123-164)/(46-98) 123/54 (05/04 2148) SpO2:  [95 %-100 %] 97 % (05/04 2148) Weight:  [58.7 kg] 58.7 kg (05/04 1440) Last BM Date: 03/21/21  Intake/Output from previous day: 05/04 0701 - 05/05 0700 In: 2022.8 [I.V.:2022.8] Out: 1875 [Urine:1800; Blood:75] Intake/Output this shift: No intake/output data recorded.  Gen: NAD, comfortable CV: RRR Pulm: Normal work of breathing Abd: Soft, appropriately ttp around incisions, nondistended. Incisions c/d/i without erythema or drainage Ext: SCDs in place  Lab Results: CBC  Recent Labs    03/23/21 0443  WBC 11.2*  HGB 12.5  HCT 38.3  PLT 247   BMET Recent Labs    03/23/21 0443  NA 132*  K 4.5  CL 103  CO2 22  GLUCOSE 125*  BUN 7*  CREATININE 0.70  CALCIUM 9.3   PT/INR No results for input(s): LABPROT, INR in the last 72 hours. ABG No results for input(s): PHART, HCO3 in the last 72 hours.  Invalid input(s): PCO2, PO2  Studies/Results:  Anti-infectives: Anti-infectives (From admission, onward)   Start     Dose/Rate Route Frequency Ordered Stop   03/22/21 1400  neomycin (MYCIFRADIN) tablet 1,000 mg  Status:  Discontinued       "And" Linked Group Details   1,000 mg Oral 3 times per day 03/22/21 0606 03/22/21 0622   03/22/21 1400  metroNIDAZOLE (FLAGYL) tablet 1,000 mg  Status:  Discontinued       "And" Linked Group Details   1,000 mg Oral 3 times per day 03/22/21 0606 03/22/21 0622   03/22/21 0615  cefoTEtan (CEFOTAN) 2 g in sodium chloride 0.9 % 100 mL IVPB        2 g 200 mL/hr over 30 Minutes Intravenous On call to O.R. 03/22/21 0606 03/22/21 1900       Assessment/Plan: Patient Active Problem List    Diagnosis Date Noted  . S/P laparoscopic-assisted sigmoidectomy 03/22/2021  . CHRONIC RHINITIS 12/13/2010  . COUGH 07/13/2009  . BREAST CANCER 07/12/2009  . PURE HYPERCHOLESTEROLEMIA 07/12/2009  . MERALGIA PARESTHETICA 07/12/2009  . MACULAR DEGENERATION 07/12/2009  . GERD 07/12/2009  . OSTEOARTHRITIS 07/12/2009  . BACK PAIN, LUMBAR 07/12/2009  . OSTEOPENIA 07/12/2009  . URINARY INCONTINENCE 07/12/2009  . BREAST BIOPSY, BY INCISION, HX OF 07/12/2009   s/p Procedure(s): XI ROBOTIC ASSISTED LOWER ANTERIOR RESECTION TAKEDOWN OF COLOVAGINAL  FISTULA  AND REPAIR AND BILATERAL TAP BLOCKS FLEXIBLE SIGMOIDOSCOPY CYSTOSCOPY with FIREFLY INJECTION AND OPEN ENDED STENT PLACEMENT 03/22/2021  -We spent time reviewing her procedure, findings and answering her questions this morning -Full liquids, advance as tolerated -Awaiting return of bowel fxn -Ambulate 5x/day -D/C Foley, D/C IVF -Continue Entereg -PPx: SQH, SCDs   LOS: 1 day   Nadeen Landau, MD [facility] Use [email or web address] to contact on call provider

## 2021-03-23 NOTE — Progress Notes (Signed)
Pharmacy Brief Note - Alvimopan (Entereg)  The standing order set for alvimopan (Entereg) now includes an automatic order to discontinue the drug after the patient has had a bowel movement. The change was approved by the Pharmacy & Therapeutics Committee and the Medical Executive Committee.  This patient has had a bowel movement documented by nursing. Therefore, alvimopan has been discontinued. If there are questions, please contact the pharmacy at [phone].  Thank you    

## 2021-03-23 NOTE — TOC Progression Note (Signed)
Transition of Care [facility]) - Progression Note    Patient Details  Name: Jessica Mcdonald MRN: [id number] Date of Birth: 11/03/1936  Transition of Care [facility]) CM/SW Contact  Bluma Buresh, Juliann Pulse, RN Phone Number: 03/23/2021, 1:44 PM  Clinical Narrative: PT recc rw-patient agreed to rw-no preference of dme company-[employer] rep Zach aware to deliver rw to rm prior d/c.      Expected Discharge Plan: Home/Self Care Barriers to Discharge: Continued Medical Work up  Expected Discharge Plan and Services Expected Discharge Plan: Home/Self Care   Discharge Planning Services: CM Consult   Living arrangements for the past 2 months: Assisted Living Facility                 DME Arranged: Walker rolling DME Agency: [employer] Date DME Agency Contacted: 03/23/21 Time DME Agency Contacted: [phone] Representative spoke with at DME Agency: [facility] ([place]) Interventions    Readmission Risk Interventions No flowsheet data found.

## 2021-03-23 NOTE — Evaluation (Signed)
Physical Therapy Evaluation Patient Details Name: Jessica Mcdonald MRN: [id number] DOB: 1935/12/09 Today's Date: 03/23/2021   History of Present Illness  Pt is 85 yo female admitted with Colovaginal fistula on 02/20/21.  S/p low anterior resection , takedown of colovaginal fistula and repair, flexible sigmoidoscopy, cystoscopy and stent placement on 03/22/21. Pt wiht hx including breast CA, macular degeneration, GERD, OA,  Clinical Impression  Pt admitted for above surgery.  She has been mobilizing in room with spouse.  She was able to demonstrate transfers and ambulation with PT safely.  She did have very mild antalgic gait but still at near normal speed.  Discussed could use RW for pain control if desired.  No further skilled PT indicated.  Continue to recommend ambulation with family.     Follow Up Recommendations No PT follow up    Equipment Recommendations  Rolling walker with 5" wheels (Could use RW for pain control if pt desires but if declines that is also okay)    Recommendations for Other Services       Precautions / Restrictions Precautions Precautions: None      Mobility  Bed Mobility Overal bed mobility: Independent                  Transfers Overall transfer level: Needs assistance Equipment used: None Transfers: Sit to/from Stand Sit to Stand: Supervision         General transfer comment: Supervision from therapist but has also performed transfers with spouse present; performed sit to stand and bed mobility easily  Ambulation/Gait Ambulation/Gait assistance: Supervision Gait Distance (Feet): 250 Feet Assistive device: None Gait Pattern/deviations: Step-through pattern;Trunk flexed     General Gait Details: Very Mild antalgic and trunk flexed due to pain but ambulated at near normal speed with steady balance  Stairs            Wheelchair Mobility    Modified Rankin (Stroke Patients Only)       Balance Overall balance assessment:  Independent   Sitting balance-Leahy Scale: Normal       Standing balance-Leahy Scale: Good                               Pertinent Vitals/Pain Pain Assessment: 0-10 Pain Score:  (1/10 rest, 6/10 walk) Pain Location: lower abd - also reports occasional sharp pain Pain Descriptors / Indicators: Discomfort Pain Intervention(s): Limited activity within patient's tolerance;Monitored during session    Home Living Family/patient expects to be discharged to:: Private residence Living Arrangements: Spouse/significant other Available Help at Discharge: Family;Available PRN/intermittently Type of Home: House (at [employer] [facility]) Home Access: Level entry     Home Layout: One level Home Equipment: Shower seat - built in      Prior Function Level of Independence: Independent         Comments: Very independent - 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, yoga, and works with Paramedic        Extremity/Trunk Assessment   Upper Extremity Assessment Upper Extremity Assessment: Overall WFL for tasks assessed    Lower Extremity Assessment Lower Extremity Assessment: Overall WFL for tasks assessed    Cervical / Trunk Assessment Cervical / Trunk Assessment: Other exceptions Cervical / Trunk Exceptions: Mild forward lean due to pain  Communication   Communication: No difficulties  Cognition Arousal/Alertness: Awake/alert Behavior During Therapy: WFL for tasks assessed/performed Overall Cognitive Status: Within Functional Limits for tasks assessed  General Comments General comments (skin integrity, edema, etc.): Discussed could use walker for pain control if wanted    Exercises     Assessment/Plan    PT Assessment Patent does not need any further PT services  PT Problem List         PT Treatment Interventions      PT Goals (Current goals can be found in the Care Plan section)  Acute Rehab PT  Goals Patient Stated Goal: return home PT Goal Formulation: All assessment and education complete, DC therapy    Frequency     Barriers to discharge        Co-evaluation               AM-PAC PT "6 Clicks" Mobility  Outcome Measure Help needed turning from your back to your side while in a flat bed without using bedrails?: None Help needed moving from lying on your back to sitting on the side of a flat bed without using bedrails?: None Help needed moving to and from a bed to a chair (including a wheelchair)?: None Help needed standing up from a chair using your arms (e.g., wheelchair or bedside chair)?: None Help needed to walk in hospital room?: None Help needed climbing 3-5 steps with a railing? : A Little 6 Click Score: 23    End of Session   Activity Tolerance: Patient tolerated treatment well Patient left: in bed;with call bell/phone within reach;with family/visitor present Nurse Communication: Mobility status      Time: 5284-1324 PT Time Calculation (min) (ACUTE ONLY): 17 min   Charges:   PT Evaluation $PT Eval Low Complexity: 1 Low          Mary-Ann Pennella, PT Acute Rehab Services Pager [phone] [facility] [phone]    Karlton Lemon 03/23/2021, 11:52 AM

## 2021-03-24 LAB — BASIC METABOLIC PANEL
Anion gap: 7 (ref 5–15)
BUN: 8 mg/dL (ref 8–23)
CO2: 24 mmol/L (ref 22–32)
Calcium: 9.6 mg/dL (ref 8.9–10.3)
Chloride: 107 mmol/L (ref 98–111)
Creatinine, Ser: 0.79 mg/dL (ref 0.44–1.00)
GFR, Estimated: >60 mL/min (ref >60)
Glucose, Bld: 99 mg/dL (ref 70–99)
Potassium: 4.5 mmol/L (ref 3.5–5.1)
Sodium: 138 mmol/L (ref 135–145)

## 2021-03-24 LAB — SURGICAL PATHOLOGY

## 2021-03-24 LAB — CBC
HCT: 36.3 % (ref 36.0–46.0)
Hemoglobin: 11.9 g/dL — ABNORMAL LOW (ref 12.0–15.0)
MCH: 30.7 pg (ref 26.0–34.0)
MCHC: 32.8 g/dL (ref 30.0–36.0)
MCV: 93.8 fL (ref 80.0–100.0)
Platelets: 235 10*3/uL (ref 150–400)
RBC: 3.87 MIL/uL (ref 3.87–5.11)
RDW: 14.5 % (ref 11.5–15.5)
WBC: 7.6 10*3/uL (ref 4.0–10.5)
nRBC: 0 % (ref 0.0–0.2)

## 2021-03-24 MED ORDER — TRAMADOL HCL 50 MG PO TABS: 50.0000 mg | ORAL_TABLET | Freq: Four times a day (QID) | ORAL | 0 refills | Status: AC | PRN

## 2021-03-24 NOTE — Op Note (Signed)
Operative Note  Preoperative diagnosis:  1.  Colovaginal fistula  Postoperative diagnosis: 1.  Colovaginal fistula  Procedure(s): 1.  Cystoscopy with ureteral injection of firefly and bilateral ureteral stent placement 2.  Bilateral retrograde pyelograms with intraoperative interpretation of fluoroscopic imaging  Surgeon: Ellison Hughs, MD  Assistants:  None  Anesthesia:  General  Complications:  None  EBL: Less than 5 mL  Specimens: 1.  None  Drains/Catheters: 1.  Bilateral 5 French ureteral catheters 2.  16 French Foley catheter  Intraoperative findings:   1. No intravesical abnormalities 2. Solitary left collecting system with no filling defects or dilation involving the left ureter or left renal pelvis seen on retrograde pyelogram 3. Solitary right collecting system with no filling defects or dilation involving the right ureter or right renal pelvis seen on retrograde pyelogram  Indication:  Jessica Mcdonald is a 85 y.o. female with a history of a colovaginal fistula.  She is here today to undergo robotic colovaginal fistula repair with Dr. Dema Severin.  Urology has been consulted to place ureteral stents and instill ureteral firefly to aid in ureteral identification during the pelvic dissection.  She has been consented for the above procedures, voices understanding and wishes to proceed.  Description of procedure:  After informed consent was obtained, the patient was brought to the operating room and general anesthesia was administered. The patient was then placed in the dorsolithotomy position and prepped and draped in the usual sterile fashion. A timeout was performed. A 23 French rigid cystoscope was then inserted into the urethral meatus and advanced into the bladder under direct vision. A complete bladder survey revealed no intravesical pathology.  A 5 French ureteral catheter was then inserted into the left ureteral orifice and a retrograde pyelogram was obtained, with  the findings listed above.  Approximately 7 mL of firefly was then injected into the left ureter.  The Glidewire was then replaced back through the ureteral catheter up to the left renal pelvis, or fluoroscopic guidance.  The ureteral catheter was then advanced up to the proximal aspects of the left ureter and the wire was removed, leaving the catheter in place.  The same procedure was then performed on the right side.  The cystoscope was then removed.  A 16 French Foley catheter was placed and the ureteral catheters were secured to the Foley catheter with a 2-0 silk suture.  The ureteral catheters and Foley catheter were then placed to gravity drainage.  Dr. Dema Severin then proceeded with his portion of the case.  Plan: Ureteral catheter and Foley catheter removal per Dr. Dema Severin.

## 2021-03-24 NOTE — Discharge Instructions (Signed)
POST OP INSTRUCTIONS AFTER COLON SURGERY  1. DIET: Be sure to include lots of fluids daily to stay hydrated - 64oz of water per day (8, 8 oz glasses).  Avoid fast food or heavy meals for the first couple of weeks as your are more likely to get nauseated. Avoid raw/uncooked fruits or vegetables for the first 4 weeks (its ok to have these if they are blended into smoothie form). If you have fruits/vegetables, make sure they are cooked until soft enough to mash on the roof of your mouth and chew your food well. Otherwise, diet as tolerated.  2. Take your usually prescribed home medications unless otherwise directed.  3. PAIN CONTROL: a. Pain is best controlled by a usual combination of three different methods TOGETHER: i. Ice/Heat ii. Over the counter pain medication iii. Prescription pain medication b. Most patients will experience some swelling and bruising around the surgical site.  Ice packs or heating pads (30-60 minutes up to 6 times a day) will help. Some people prefer to use ice alone, heat alone, alternating between ice & heat.  Experiment to what works for you.  Swelling and bruising can take several weeks to resolve.   c. It is helpful to take an over-the-counter pain medication regularly for the first few weeks: i. Ibuprofen (Motrin/Advil) - 200mg  tabs - take 3 tabs (600mg ) every 6 hours as needed for pain (unless you have been directed previously to avoid NSAIDs/ibuprofen) ii. Acetaminophen (Tylenol) - you may take 650mg  every 6 hours as needed. You can take this with motrin as they act differently on the body. If you are taking a narcotic pain medication that has acetaminophen in it, do not take over the counter tylenol at the same time. iii. NOTE: You may take both of these medications together - most patients  find it most helpful when alternating between the two (i.e. Ibuprofen at 6am, tylenol at 9am, ibuprofen at 12pm ...) d. A  prescription for pain medication should be given to you  upon discharge.  Take your pain medication as prescribed if your pain is not adequatly controlled with the over-the-counter pain reliefs mentioned above.  4. Avoid getting constipated.  Between the surgery and the pain medications, it is common to experience some constipation.  Increasing fluid intake and taking a fiber supplement (such as Metamucil, Citrucel, FiberCon, MiraLax, etc) 1-2 times a day regularly will usually help prevent this problem from occurring.  A mild laxative (prune juice, Milk of Magnesia, MiraLax, etc) should be taken according to package directions if there are no bowel movements after 48 hours.    5. Dressing: Your incisions are covered in Dermabond which is like sterile superglue for the skin. This will come off on it's own in a couple weeks. It is waterproof and you may bathe normally starting the day after your surgery in a shower. If you were discharged with a dressing, you may remove this the day after arrival home. Avoid baths/pools/lakes/oceans until your wounds have fully healed.  6. ACTIVITIES as tolerated:   a. Avoid heavy lifting (>10lbs or 1 gallon of milk) for the next 6 weeks. b. You may resume regular daily activities as tolerated--such as daily self-care, walking, climbing stairs--gradually increasing activities as tolerated.  If you can walk 30 minutes without difficulty, it is safe to try more intense activity such as jogging, treadmill, bicycling, low-impact aerobics.  c. DO NOT PUSH THROUGH PAIN.  Let pain be your guide: If it hurts to do something, don't  do it. d. You may drive when you are no longer taking prescription pain medication, you can comfortably wear a seatbelt, and you can safely maneuver your car and apply brakes.  7. FOLLOW UP in our office a. Please call [facility] at [phone] to set up an appointment to see your surgeon in the office for a follow-up appointment approximately 2 weeks after your surgery. b. Make sure that you call for this  appointment the day you arrive home to insure a convenient appointment time.  9. If you have disability or family leave forms that need to be completed, you may have them completed by your primary care physician's office; for return to work instructions, please ask our office staff and they will be happy to assist you in obtaining this documentation   When to call us [phone]: 1. Poor pain control 2. Reactions / problems with new medications (rash/itching, etc)  3. Fever over 101.5 F (38.5 C) 4. Inability to urinate 5. Nausea/vomiting 6. Worsening swelling or bruising 7. Continued bleeding from incision. 8. Increased pain, redness, or drainage from the incision  The clinic staff is available to answer your questions during regular business hours (8:30am-5pm).  Please don't hesitate to call and ask to speak to one of our nurses for clinical concerns.   A surgeon from [facility] is always on call at the hospitals   If you have a medical emergency, go to the nearest emergency room or call 911.  [facility], [place] [address], [place], [place], [place]  [address] MAIN: [phone] FAX: [phone] [email or web address]

## 2021-03-24 NOTE — Progress Notes (Signed)
Subjective No acute events. Feeling reasonably well. Pain well controlled. UP walking without difficulty. Has a walker in event she needs however. No n/v. Nursing recorded bm but patient said none yet. Passing some flatus.  Objective: Vital signs in last 24 hours: Temp:  [98.1 F (36.7 C)-98.2 F (36.8 C)] 98.1 F (36.7 C) (05/06 1318) Pulse Rate:  [66-103] 103 (05/06 1318) Resp:  [15-18] 18 (05/06 1318) BP: (153-154)/(62-78) 154/67 (05/06 1318) SpO2:  [95 %-96 %] 96 % (05/06 1318) Weight:  [56.7 kg] 56.7 kg (05/06 0500) Last BM Date: 03/23/21  Intake/Output from previous day: 05/05 0701 - 05/06 0700 In: 360 [P.O.:360] Out: 2450 [Urine:2450] Intake/Output this shift: Total I/O In: -  Out: 1600 [Urine:1600]  Gen: NAD, comfortable; up in chair listening to npr CV: RRR Pulm: Normal work of breathing Abd: Soft, appropriately ttp around incisions, nondistended. Incisions c/d/i without erythema or drainage Ext: SCDs in place  Lab Results: CBC  Recent Labs    03/23/21 0443 03/24/21 0403  WBC 11.2* 7.6  HGB 12.5 11.9*  HCT 38.3 36.3  PLT 247 235   BMET Recent Labs    03/23/21 0443 03/24/21 0403  NA 132* 138  K 4.5 4.5  CL 103 107  CO2 22 24  GLUCOSE 125* 99  BUN 7* 8  CREATININE 0.70 0.79  CALCIUM 9.3 9.6   PT/INR No results for input(s): LABPROT, INR in the last 72 hours. ABG No results for input(s): PHART, HCO3 in the last 72 hours.  Invalid input(s): PCO2, PO2  Studies/Results:  Anti-infectives: Anti-infectives (From admission, onward)   Start     Dose/Rate Route Frequency Ordered Stop   03/22/21 1400  neomycin (MYCIFRADIN) tablet 1,000 mg  Status:  Discontinued       "And" Linked Group Details   1,000 mg Oral 3 times per day 03/22/21 0606 03/22/21 0622   03/22/21 1400  metroNIDAZOLE (FLAGYL) tablet 1,000 mg  Status:  Discontinued       "And" Linked Group Details   1,000 mg Oral 3 times per day 03/22/21 0606 03/22/21 0622   03/22/21 0615  cefoTEtan  (CEFOTAN) 2 g in sodium chloride 0.9 % 100 mL IVPB        2 g 200 mL/hr over 30 Minutes Intravenous On call to O.R. 03/22/21 0606 03/22/21 1900       Assessment/Plan: Patient Active Problem List   Diagnosis Date Noted  . S/P laparoscopic-assisted sigmoidectomy 03/22/2021  . CHRONIC RHINITIS 12/13/2010  . COUGH 07/13/2009  . BREAST CANCER 07/12/2009  . PURE HYPERCHOLESTEROLEMIA 07/12/2009  . MERALGIA PARESTHETICA 07/12/2009  . MACULAR DEGENERATION 07/12/2009  . GERD 07/12/2009  . OSTEOARTHRITIS 07/12/2009  . BACK PAIN, LUMBAR 07/12/2009  . OSTEOPENIA 07/12/2009  . URINARY INCONTINENCE 07/12/2009  . BREAST BIOPSY, BY INCISION, HX OF 07/12/2009   s/p Procedure(s): XI ROBOTIC ASSISTED LOWER ANTERIOR RESECTION TAKEDOWN OF COLOVAGINAL  FISTULA  AND REPAIR AND BILATERAL TAP BLOCKS FLEXIBLE SIGMOIDOSCOPY CYSTOSCOPY with FIREFLY INJECTION AND OPEN ENDED STENT PLACEMENT 03/22/2021  -Soft diet as tolerated -Awaiting return of bowel fxn -Ambulate 5x/day -PPx: SQH, SCDs   LOS: 2 days   Nadeen Landau, MD [facility] Use [email or web address] to contact on call provider

## 2021-03-24 NOTE — Care Management Important Message (Signed)
Important Message  Patient Details IM Letter given to the Patient. Name: Jessica Mcdonald MRN: [id number] Date of Birth: 1936/02/03   Medicare Important Message Given:  Yes     Kerin Salen 03/24/2021, 12:38 PM

## 2021-03-25 NOTE — Progress Notes (Signed)
[employer] [facility] already picked up the walker.

## 2021-03-25 NOTE — Progress Notes (Signed)
Patient discharged home with daughter/husband, discharge instructions given and explained to patient, she verbalized understanding, denies any pain/distress, surgical incisions clean/dry/intact, no sign of infection noted. Accompanied home by daughter.

## 2021-03-25 NOTE — Discharge Summary (Signed)
Patient ID: Jessica Mcdonald [id number] y.o. 27-Sep-1936  03/22/2021  Discharge date and time: 03/25/2021  Admitting Physician: [place]  Discharge Physician: Jessica Mcdonald  Admission Diagnoses: S/P laparoscopic-assisted sigmoidectomy [Z90.49] Patient Active Problem List   Diagnosis Date Noted  . S/P laparoscopic-assisted sigmoidectomy 03/22/2021  . CHRONIC RHINITIS 12/13/2010  . COUGH 07/13/2009  . BREAST CANCER 07/12/2009  . PURE HYPERCHOLESTEROLEMIA 07/12/2009  . MERALGIA PARESTHETICA 07/12/2009  . MACULAR DEGENERATION 07/12/2009  . GERD 07/12/2009  . OSTEOARTHRITIS 07/12/2009  . BACK PAIN, LUMBAR 07/12/2009  . OSTEOPENIA 07/12/2009  . URINARY INCONTINENCE 07/12/2009  . BREAST BIOPSY, BY INCISION, HX OF 07/12/2009     Discharge Diagnoses: Colovaginal fistula Patient Active Problem List   Diagnosis Date Noted  . S/P laparoscopic-assisted sigmoidectomy 03/22/2021  . CHRONIC RHINITIS 12/13/2010  . COUGH 07/13/2009  . BREAST CANCER 07/12/2009  . PURE HYPERCHOLESTEROLEMIA 07/12/2009  . MERALGIA PARESTHETICA 07/12/2009  . MACULAR DEGENERATION 07/12/2009  . GERD 07/12/2009  . OSTEOARTHRITIS 07/12/2009  . BACK PAIN, LUMBAR 07/12/2009  . OSTEOPENIA 07/12/2009  . URINARY INCONTINENCE 07/12/2009  . BREAST BIOPSY, BY INCISION, HX OF 07/12/2009    Operations: Procedure(s): XI ROBOTIC ASSISTED LOWER ANTERIOR RESECTION TAKEDOWN OF COLOVAGINAL  FISTULA  AND REPAIR AND BILATERAL TAP BLOCKS FLEXIBLE SIGMOIDOSCOPY CYSTOSCOPY with FIREFLY INJECTION AND OPEN ENDED STENT PLACEMENT  Admission Condition: good  Discharged Condition: good  Indication for Admission: Colovaginal fistula  Hospital Course: [name] presented for elective XI ROBOTIC ASSISTED LOWER ANTERIOR RESECTION TAKEDOWN OF COLOVAGINAL  FISTULA  AND REPAIR AND BILATERAL TAP BLOCKS FLEXIBLE SIGMOIDOSCOPY CYSTOSCOPY with FIREFLY INJECTION AND OPEN ENDED STENT PLACEMENT 03/22/2021.  She did well  postoperatively and was discharged on 03/25/21.  Consults: None  Significant Diagnostic Studies: None  Treatments: surgery: as above  Disposition: Home  Patient Instructions:  Allergies as of 03/25/2021   No Known Allergies     Medication List    STOP taking these medications   cephALEXin 250 MG capsule Commonly known as: KEFLEX     TAKE these medications   ALIVE MULTI-VITAMIN PO Take 1 tablet by mouth daily.   Azelaic Acid 15 % gel Apply 1 application topically 2 (two) times daily.   CALCIUM CITRATE-VITAMIN D PO Take 2 tablets by mouth 2 (two) times daily. 1200 mg / 1000 mg slow released   estradiol 0.1 MG/GM vaginal cream Commonly known as: ESTRACE Place 1 Applicatorful vaginally 2 (two) times a week.   Fish Oil 1000 MG Caps Take 1,400 mg by mouth See admin instructions. Take 1000 mg 4 times weekly   fluticasone 50 MCG/ACT nasal spray Commonly known as: FLONASE Place 1 spray into both nostrils at bedtime.   ipratropium 0.06 % nasal spray Commonly known as: ATROVENT Place 1-2 sprays into both nostrils daily.   Magnesium 500 MG Tabs Take 500 mg by mouth every other day.   saccharomyces boulardii 250 MG capsule Commonly known as: FLORASTOR Take 250 mg by mouth 3 (three) times a week.   traMADol 50 MG tablet Commonly known as: Ultram Take 1 tablet (50 mg total) by mouth every 6 (six) hours as needed for up to 5 days (postop pain not controlled with tylenol and ibuprofen first).   TURMERIC PO Take 1,000 mg by mouth daily.   vitamin B-12 1000 MCG tablet Commonly known as: CYANOCOBALAMIN Take 1,000 mcg by mouth daily.            Durable Medical Equipment  (From admission, onward)  Start     Ordered   03/23/21 1343  For home use only DME Walker rolling  Once       Question Answer Comment  Walker: With 5 Inch Wheels   Patient needs a walker to treat with the following condition Unsteady gait      03/23/21 1342          Activity: no  lifting, driving, or strenuous exercise for 4 weeks. Diet: regular diet Wound Care: keep wound clean and dry  Follow-up:  With Dr. Dema Severin in 3 weeks.  Signed: [place], [place]   03/25/2021, 8:31 AM

## 2021-03-25 NOTE — Plan of Care (Signed)
  Problem: Elimination: Goal: Will not experience complications related to bowel motility Outcome: Progressing   Problem: Elimination: Goal: Will not experience complications related to urinary retention Outcome: Progressing   Problem: Pain Managment: Goal: General experience of comfort will improve Outcome: Progressing   Problem: Safety: Goal: Ability to remain free from injury will improve Outcome: Progressing   Problem: Skin Integrity: Goal: Risk for impaired skin integrity will decrease Outcome: Progressing   

## 2021-03-25 NOTE — TOC Progression Note (Signed)
Transition of Care [facility]) - Progression Note    Patient Details  Name: NELEH MULDOON MRN: [id number] Date of Birth: [birth year]  Transition of [facility]) CM/SW Contact  Purcell Mouton, RN Phone Number: 03/25/2021, 9:57 AM  Clinical Narrative:    Pt declined RW and will purchase a cane for home use. [employer] rep was called to return RW.    Expected Discharge Plan: Home/Self Care Barriers to Discharge: Continued Medical Work up  Expected Discharge Plan and Services Expected Discharge Plan: Home/Self Care   Discharge Planning Services: CM Consult   Living arrangements for the past 2 months: [place] Expected Discharge Date: 03/25/21               DME Arranged: Gilford Rile rolling DME Agency: [employer] Date DME Agency Contacted: 03/23/21 Time DME Agency Contacted: [phone] Representative spoke with at DME Agency: [place] ([place]) Interventions    Readmission Risk Interventions No flowsheet data found.

## 2021-04-26 DIAGNOSIS — N3941 Urge incontinence: Secondary | ICD-10-CM | POA: Diagnosis not present

## 2021-04-26 DIAGNOSIS — N3281 Overactive bladder: Secondary | ICD-10-CM | POA: Diagnosis not present

## 2021-05-11 DIAGNOSIS — Z136 Encounter for screening for cardiovascular disorders: Secondary | ICD-10-CM | POA: Diagnosis not present

## 2021-05-11 DIAGNOSIS — M85852 Other specified disorders of bone density and structure, left thigh: Secondary | ICD-10-CM | POA: Diagnosis not present

## 2021-05-11 DIAGNOSIS — K219 Gastro-esophageal reflux disease without esophagitis: Secondary | ICD-10-CM | POA: Diagnosis not present

## 2021-05-11 DIAGNOSIS — Z1389 Encounter for screening for other disorder: Secondary | ICD-10-CM | POA: Diagnosis not present

## 2021-05-11 DIAGNOSIS — Z0000 Encounter for general adult medical examination without abnormal findings: Secondary | ICD-10-CM | POA: Diagnosis not present

## 2021-05-11 DIAGNOSIS — M85851 Other specified disorders of bone density and structure, right thigh: Secondary | ICD-10-CM | POA: Diagnosis not present

## 2021-05-11 DIAGNOSIS — E210 Primary hyperparathyroidism: Secondary | ICD-10-CM | POA: Diagnosis not present

## 2021-05-11 DIAGNOSIS — Z853 Personal history of malignant neoplasm of breast: Secondary | ICD-10-CM | POA: Diagnosis not present

## 2021-06-01 DIAGNOSIS — Z7189 Other specified counseling: Secondary | ICD-10-CM | POA: Diagnosis not present

## 2021-08-17 ENCOUNTER — Ambulatory Visit: Payer: Medicare Other | Attending: Internal Medicine

## 2021-08-17 ENCOUNTER — Other Ambulatory Visit (HOSPITAL_BASED_OUTPATIENT_CLINIC_OR_DEPARTMENT_OTHER): Payer: Self-pay

## 2021-08-17 DIAGNOSIS — Z23 Encounter for immunization: Secondary | ICD-10-CM

## 2021-08-17 MED ORDER — MODERNA COVID-19 BIVAL BOOSTER 50 MCG/0.5ML IM SUSP
INTRAMUSCULAR | 0 refills | Status: DC
  Filled 2021-08-17: qty 0.5, 1d supply, fill #0

## 2021-08-17 NOTE — Progress Notes (Signed)
   Covid-19 Vaccination Clinic  Name:  SHANTAYA BLUESTONE    MRN: [id number] DOB: 12-09-1935  08/17/2021  Ms. Heiser was observed post Covid-19 immunization for 15 minutes without incident. She was provided with Vaccine Information Sheet and instruction to access the V-Safe system.   Ms. Michelsen was instructed to call 911 with any severe reactions post vaccine: Difficulty breathing  Swelling of face and throat  A fast heartbeat  A bad rash all over body  Dizziness and weakness

## 2021-08-29 DIAGNOSIS — H52222 Regular astigmatism, left eye: Secondary | ICD-10-CM | POA: Diagnosis not present

## 2021-08-29 DIAGNOSIS — H524 Presbyopia: Secondary | ICD-10-CM | POA: Diagnosis not present

## 2021-08-29 DIAGNOSIS — H3530 Unspecified macular degeneration: Secondary | ICD-10-CM | POA: Diagnosis not present

## 2021-08-29 DIAGNOSIS — H5213 Myopia, bilateral: Secondary | ICD-10-CM | POA: Diagnosis not present

## 2021-08-29 DIAGNOSIS — Z961 Presence of intraocular lens: Secondary | ICD-10-CM | POA: Diagnosis not present

## 2021-08-29 DIAGNOSIS — H16142 Punctate keratitis, left eye: Secondary | ICD-10-CM | POA: Diagnosis not present

## 2021-08-29 DIAGNOSIS — H353131 Nonexudative age-related macular degeneration, bilateral, early dry stage: Secondary | ICD-10-CM | POA: Diagnosis not present

## 2021-09-07 DIAGNOSIS — H903 Sensorineural hearing loss, bilateral: Secondary | ICD-10-CM | POA: Diagnosis not present

## 2021-09-08 DIAGNOSIS — Z23 Encounter for immunization: Secondary | ICD-10-CM | POA: Diagnosis not present

## 2021-10-11 ENCOUNTER — Telehealth: Payer: Self-pay | Admitting: Gastroenterology

## 2021-10-11 NOTE — Telephone Encounter (Signed)
Hey Dr. Silverio Decamp,   We received a referral for patient for severe diverticulosis, history of reflux, history of colovesicular, and fascicular fistula. Patient have seen a GI provider in 12/2020. Records are in Epic. I also have paper records that I will send to you. Could you please review and advise on scheduling?  Thank you

## 2021-10-16 NOTE — Telephone Encounter (Signed)
Spoke with patient about the recommendation. States she understands.

## 2021-10-16 NOTE — Telephone Encounter (Signed)
Request received to transfer GI care from outside practice to [facility].  We appreciate the interest in our practice, however at this time due to high demand from patients without established GI providers we cannot accommodate this transfer.  Ability to accommodate future transfer requests may change over time and the patient can contact us again in 6-12 months if still interested in being seen at [facility].      °

## 2021-10-17 DIAGNOSIS — Z1231 Encounter for screening mammogram for malignant neoplasm of breast: Secondary | ICD-10-CM | POA: Diagnosis not present

## 2021-11-10 IMAGING — CR DG ABDOMEN 2V
2 series · 2 of 2 positions shown · non-contrast
Comparison: 01/27/2016

CLINICAL DATA: Lower abdominal pain

EXAM:
ABDOMEN - 2 VIEW

[w abdomen upright]
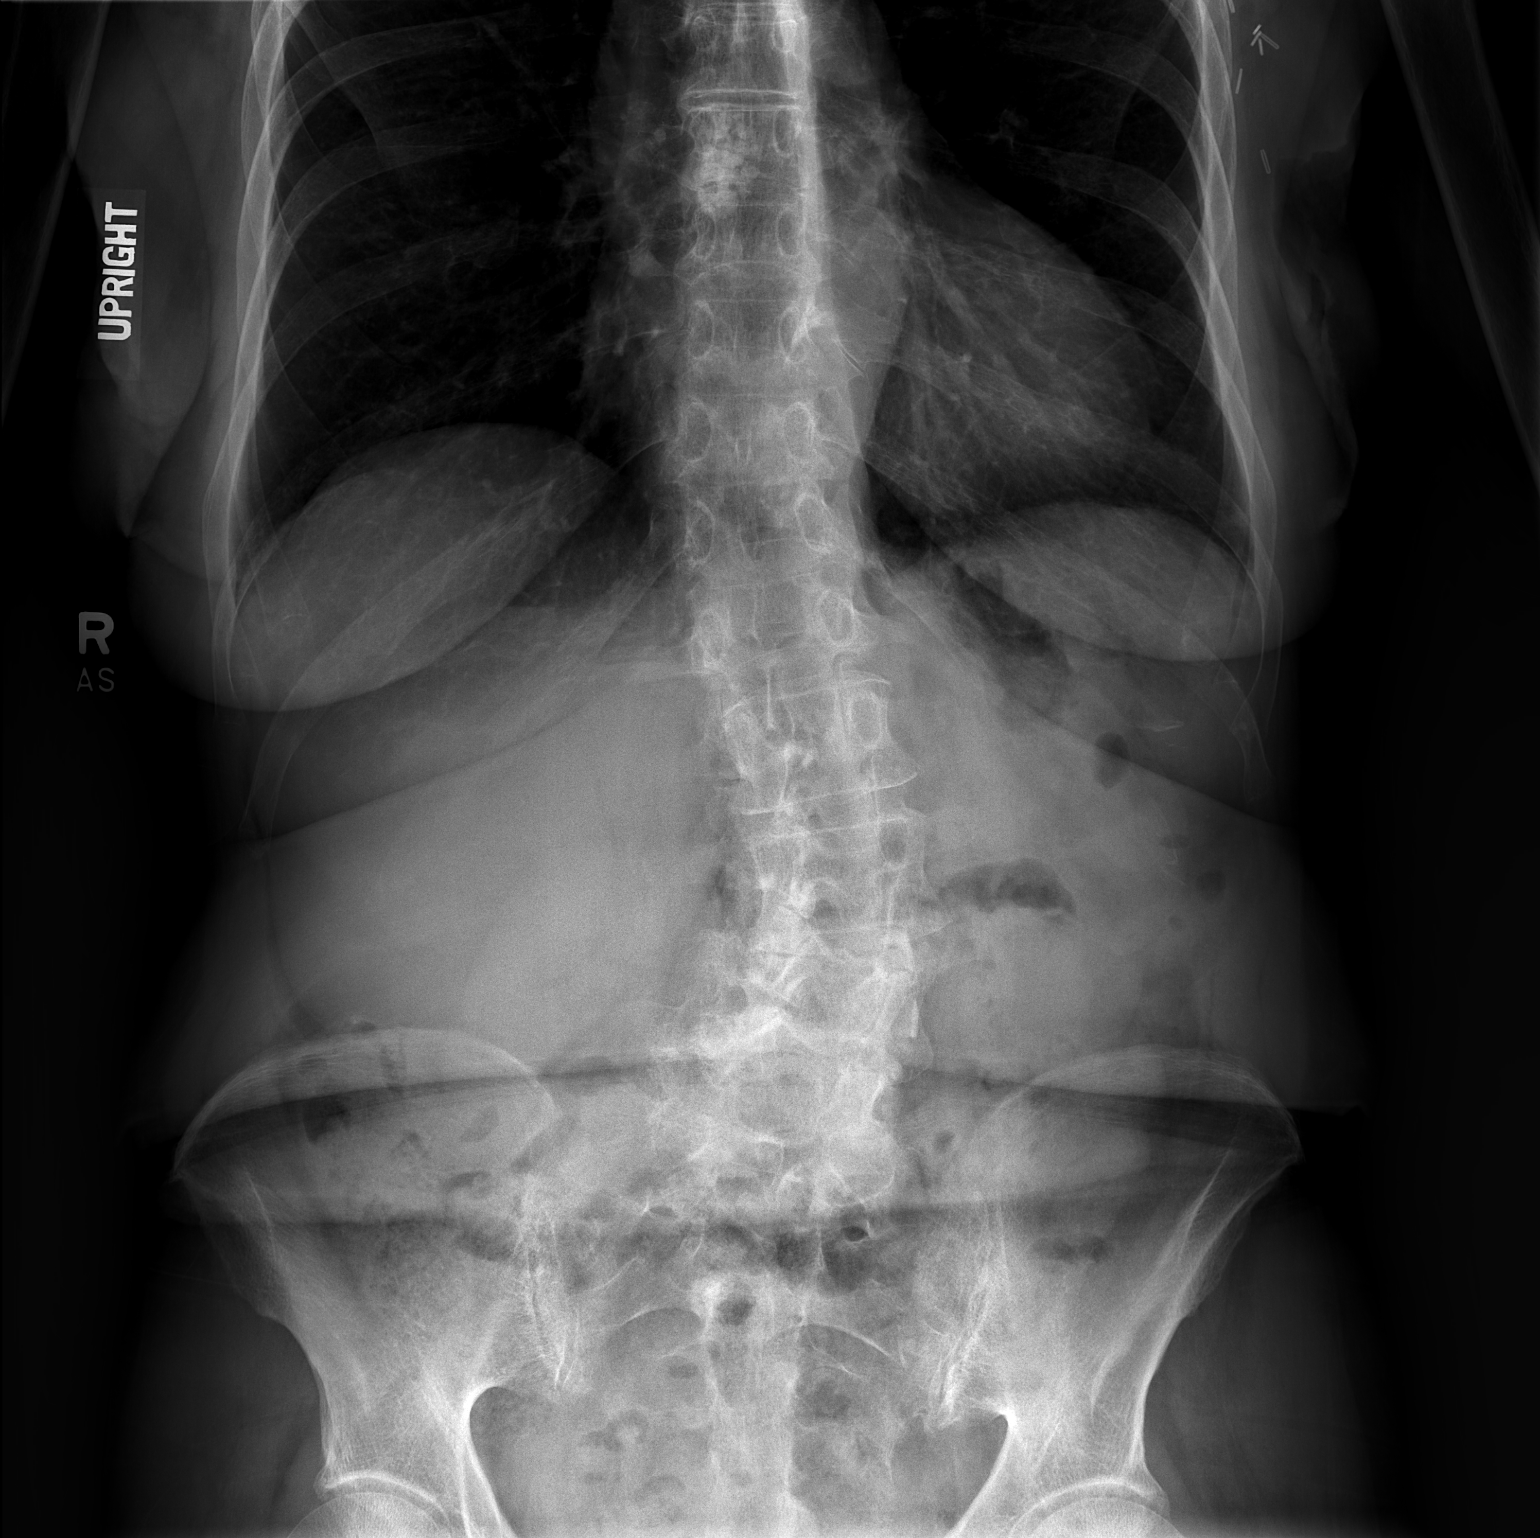

[t abdomen supine]
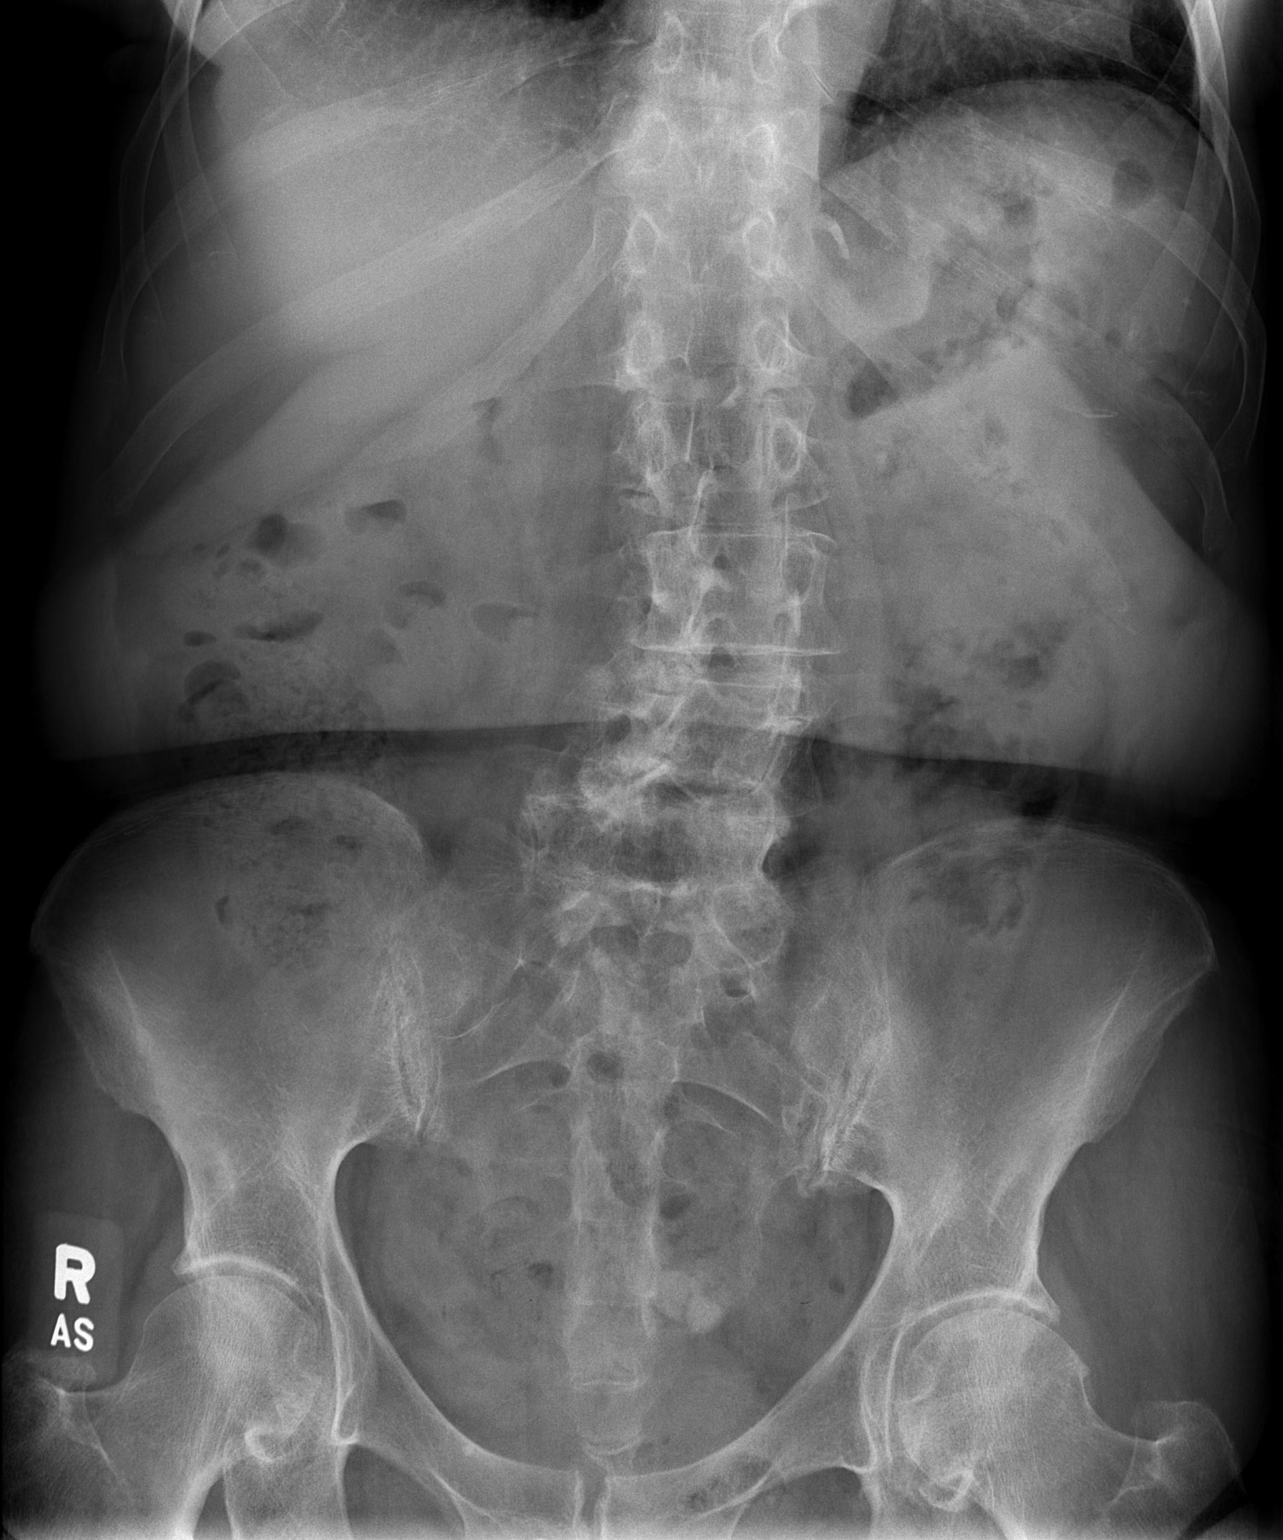

[2 of 2 positions shown; findings below may reference images not displayed]

FINDINGS: Scattered large and small bowel gas is noted. Mild retained fecal
material is noted suggesting a mild degree of constipation. No free
air is seen. Degenerative changes of lumbar spine are noted. No
abnormal mass is seen.
IMPRESSION: Mild constipation.  No other focal abnormality is noted.

## 2021-11-16 DIAGNOSIS — J029 Acute pharyngitis, unspecified: Secondary | ICD-10-CM | POA: Diagnosis not present

## 2021-11-16 DIAGNOSIS — R059 Cough, unspecified: Secondary | ICD-10-CM | POA: Diagnosis not present

## 2021-11-16 DIAGNOSIS — Z03818 Encounter for observation for suspected exposure to other biological agents ruled out: Secondary | ICD-10-CM | POA: Diagnosis not present

## 2021-11-24 DIAGNOSIS — R350 Frequency of micturition: Secondary | ICD-10-CM | POA: Diagnosis not present

## 2021-12-07 DIAGNOSIS — Z8742 Personal history of other diseases of the female genital tract: Secondary | ICD-10-CM | POA: Diagnosis not present

## 2021-12-07 DIAGNOSIS — N3281 Overactive bladder: Secondary | ICD-10-CM | POA: Diagnosis not present

## 2021-12-07 DIAGNOSIS — N3941 Urge incontinence: Secondary | ICD-10-CM | POA: Diagnosis not present

## 2021-12-15 DIAGNOSIS — E7800 Pure hypercholesterolemia, unspecified: Secondary | ICD-10-CM | POA: Diagnosis not present

## 2021-12-15 DIAGNOSIS — K219 Gastro-esophageal reflux disease without esophagitis: Secondary | ICD-10-CM | POA: Diagnosis not present

## 2021-12-15 DIAGNOSIS — M189 Osteoarthritis of first carpometacarpal joint, unspecified: Secondary | ICD-10-CM | POA: Diagnosis not present

## 2022-01-19 IMAGING — CT CT ABD-PELV W/ CM
1 of 3 series · 12 of 32 positions shown, 17 images · IV contrast (iopamidol)
Comparison: None.

CLINICAL DATA: Feculent vaginal discharge

EXAM:
CT ABDOMEN AND PELVIS WITH CONTRAST
TECHNIQUE: Multidetector CT imaging of the abdomen and pelvis was performed
using the standard protocol following bolus administration of
intravenous contrast.
CONTRAST:  100mL S363YC-BZZ IOPAMIDOL (S363YC-BZZ) INJECTION 61%

[Series 2: abd/pelvis w/cm · axial · 0.67mm/px · z∈[-474,-84]mm · 12 of 92 slices shown, 17 images]
[im 7/92  soft-tissue]
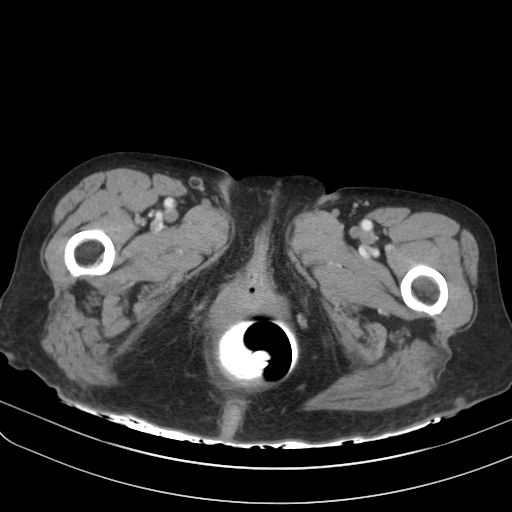
[im 7/92  bone]
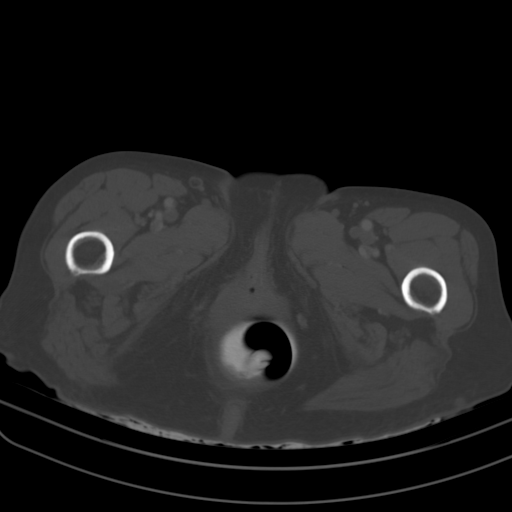
[im 14/92  soft-tissue]
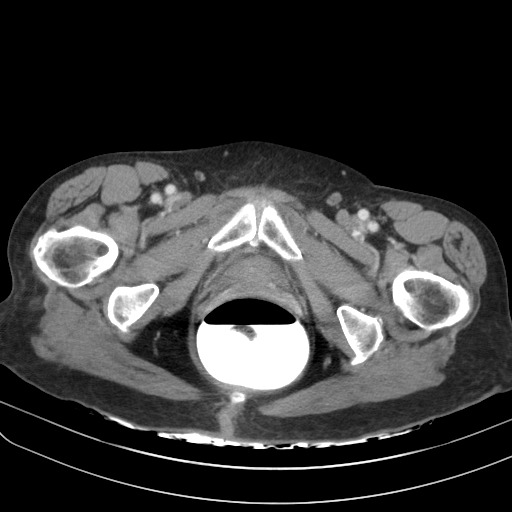
[im 20/92  soft-tissue]
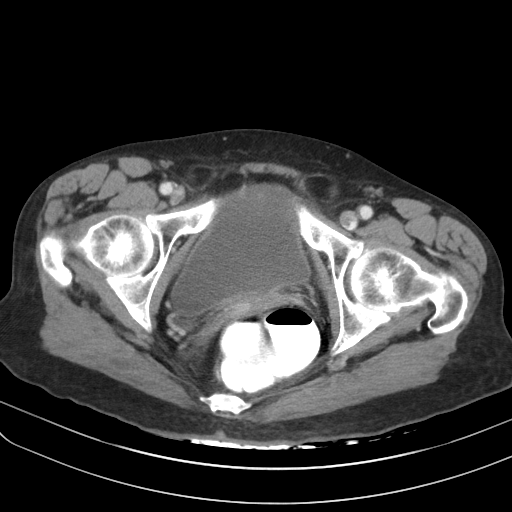
[im 33/92  soft-tissue]
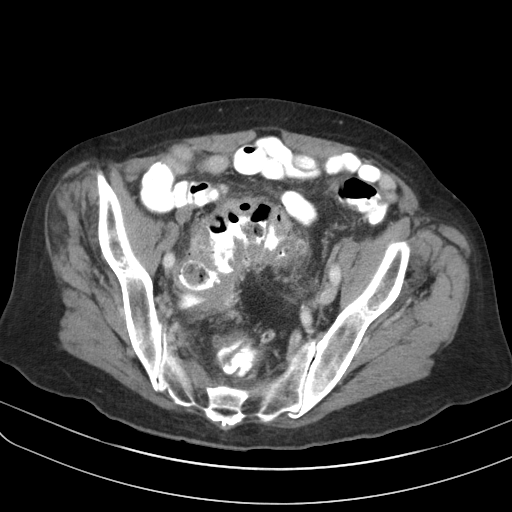
[im 40/92  soft-tissue]
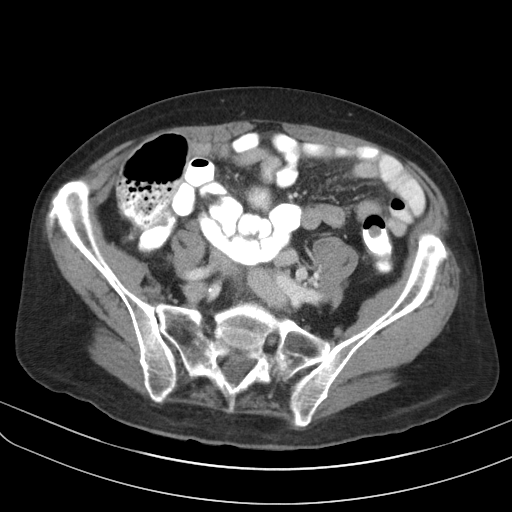
[im 46/92  soft-tissue]
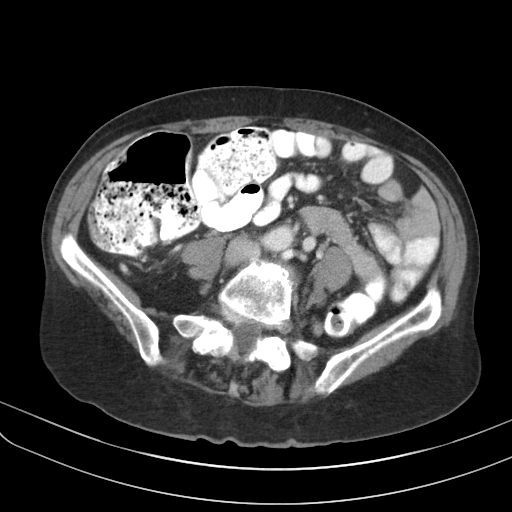
[im 53/92  soft-tissue]
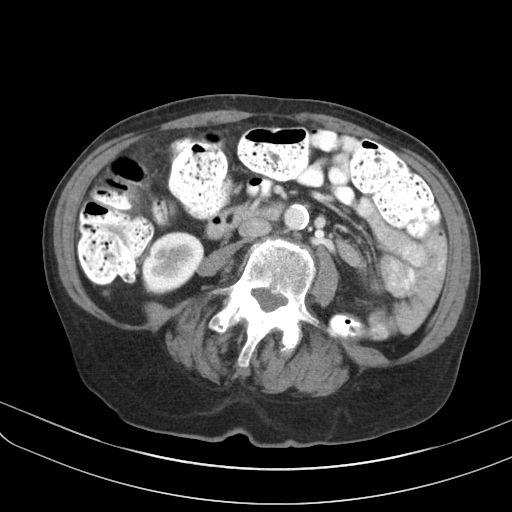
[im 59/92  soft-tissue]
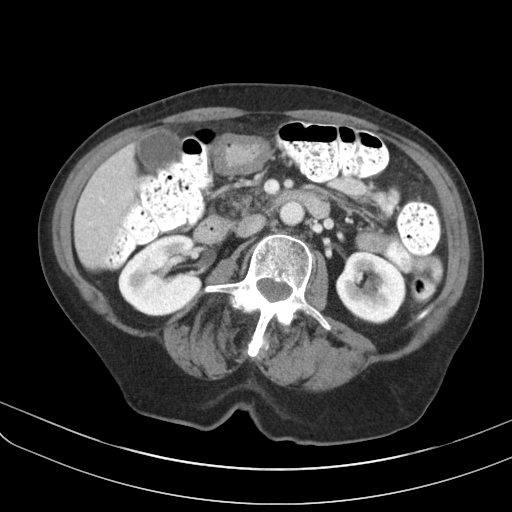
[im 66/92  lung]
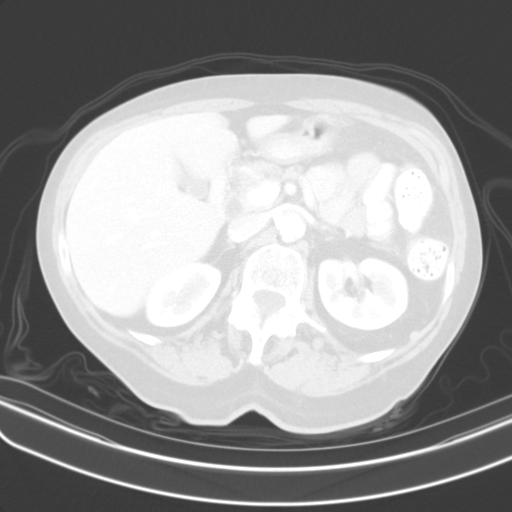
[im 72/92  soft-tissue]
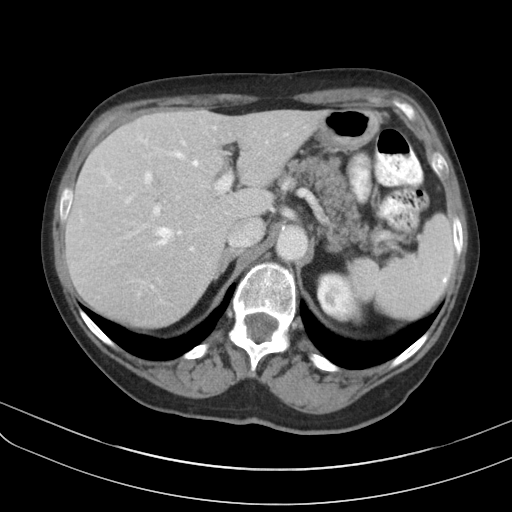
[im 72/92  lung]
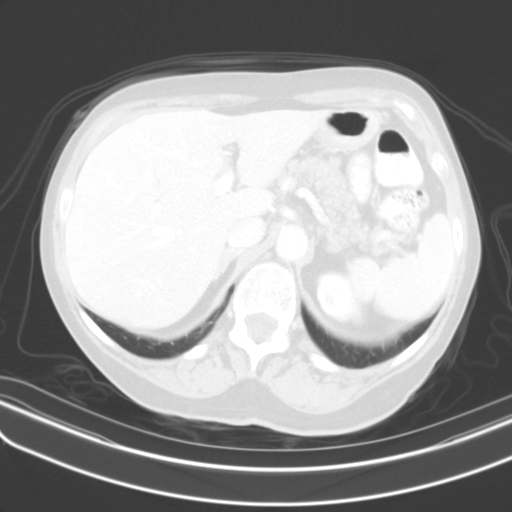
[im 72/92  bone]
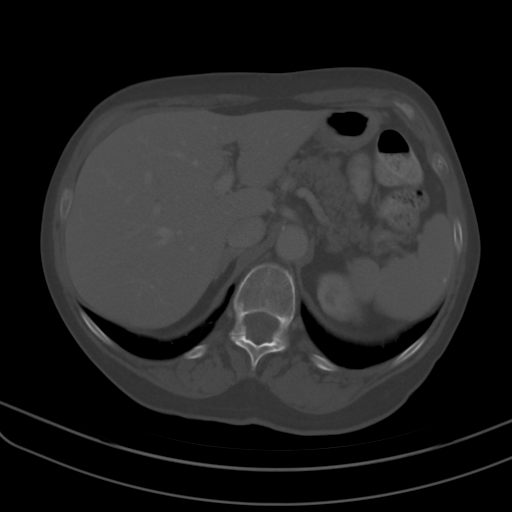
[im 79/92  soft-tissue]
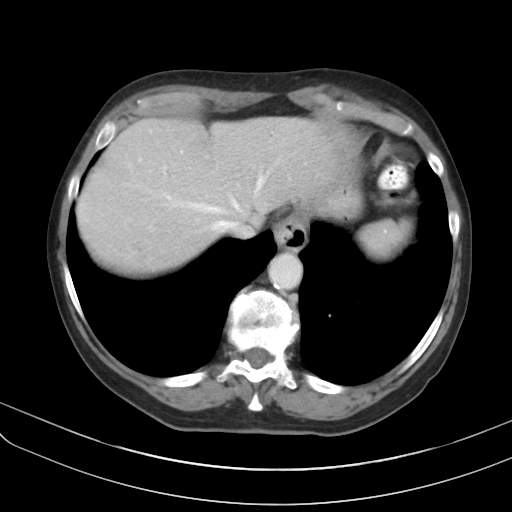
[im 79/92  lung]
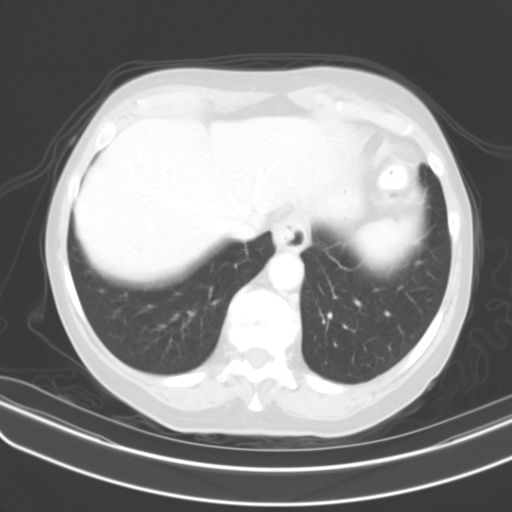
[im 85/92  soft-tissue]
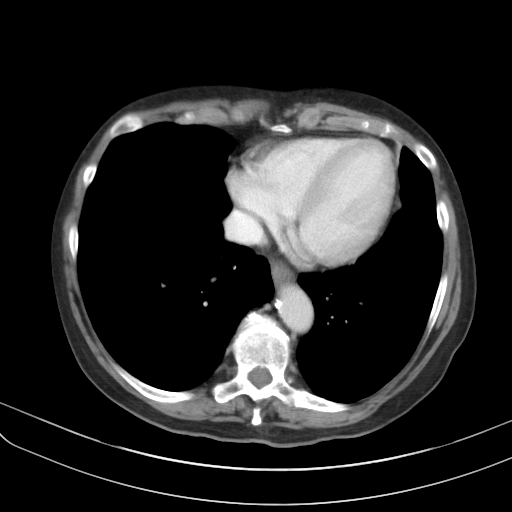
[im 85/92  lung]
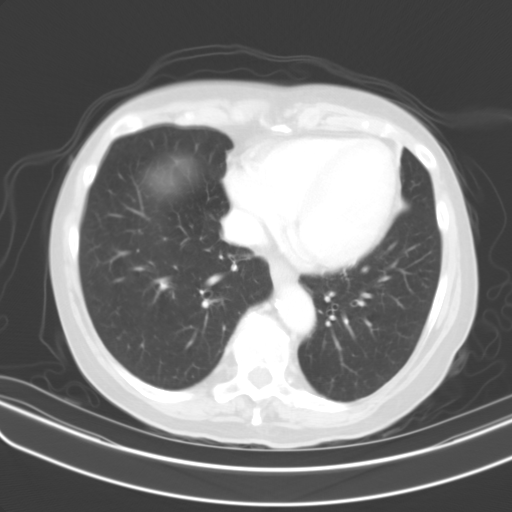

[12 of 32 positions shown; findings below may reference images not displayed]

FINDINGS: Lower chest: The visualized lung bases are clear bilaterally. The
visualized heart and pericardium are unremarkable. Small hiatal
hernia.

Hepatobiliary: Focal fatty hepatic infiltration adjacent the
falciform ligament. The liver and gallbladder are otherwise
unremarkable. The extrahepatic bile duct is mildly enlarged
measuring up to 12 mm in greatest dimension though this does appear
to taper toward the ampulla. No intrahepatic biliary ductal
dilation. No distal obstructing lesion is clearly identified.

Pancreas: Unremarkable. Specifically, the pancreatic duct is not
dilated.

Spleen: Unremarkable

Adrenals/Urinary Tract: The adrenal glands are unremarkable. The
kidneys are normal. There is a punctate focus of gas seen non
dependently within the bladder lumen of unclear significance. This
may represent the sequela of recent catheterization though a small
colovesicular fistula could result in a similar appearance. As noted
below, there is asymmetric thickening of the posterosuperior bladder
wall in the region of the suspected colovaginal fistula, best seen
on sagittal imaging.

Stomach/Bowel: The stomach and small bowel are unremarkable.
Appendix normal. There is extensive sigmoid diverticulosis with long
segment circumferential wall thickening and mild pericolonic
inflammatory stranding. Numerous shotty enhancing lymph nodes are
seen within the sigmoid colonic mesentery and along the inferior
mesenteric vein likely representing reactive adenopathy. Altogether
the findings are suggestive of acute to subacute diverticulitis.
There is a suspected colo vaginal fistula best noted on axial image
# 67-70/2 and sagittal reformat # 55/4. This is adjacent to a focal
area of bladder wall thickening, further discussed above. No
associated abscess identified. No free intraperitoneal gas. Trace
free fluid noted within the pelvis.

Vascular/Lymphatic: Moderate aortoiliac atherosclerotic
calcification. No aneurysm. No frankly pathologic adenopathy within
the abdomen and pelvis.

Reproductive: Status post hysterectomy. No adnexal masses.

Other: Drainage catheter within the rectum noted with retrograde
filling of contrast into the colon.

Musculoskeletal: No acute bone abnormality. Degenerative changes are
seen within the lumbar spine.
IMPRESSION: Long segment bowel wall thickening, background severe
diverticulosis, and shotty sigmoid mesenteric adenopathy all in
keeping with probable changes of acute to subacute diverticulitis.
No evidence of obstruction. No intra-abdominal abscess formation.

Suspected colovaginal fistula involving the distal sigmoid colon,
likely the result of diverticular disease.

Punctate focus of gas within the bladder lumen and asymmetric
bladder wall thickening in close proximity to the previously
described colovaginal fistula raising the question of an associated
colovesicular fistula. Correlation with urinalysis and urine culture
may be helpful.

Retrograde filling the contrast fails to opacify the colovaginal
fistula. This may be related to the capacious nature of the sigmoid
colon. Fluoroscopic vaginogram may better demonstrate the fistula if
desired.

Dilation of the extrahepatic bile duct, nonspecific. Correlation
with liver enzymes would be helpful to exclude an obstructive
process.

Aortic Atherosclerosis ([id number]-CNX.X).

## 2022-02-01 DIAGNOSIS — N3281 Overactive bladder: Secondary | ICD-10-CM | POA: Diagnosis not present

## 2022-02-01 DIAGNOSIS — N952 Postmenopausal atrophic vaginitis: Secondary | ICD-10-CM | POA: Diagnosis not present

## 2022-02-20 DIAGNOSIS — C44622 Squamous cell carcinoma of skin of right upper limb, including shoulder: Secondary | ICD-10-CM | POA: Diagnosis not present

## 2022-02-20 DIAGNOSIS — L814 Other melanin hyperpigmentation: Secondary | ICD-10-CM | POA: Diagnosis not present

## 2022-02-20 DIAGNOSIS — C44629 Squamous cell carcinoma of skin of left upper limb, including shoulder: Secondary | ICD-10-CM | POA: Diagnosis not present

## 2022-02-20 DIAGNOSIS — L821 Other seborrheic keratosis: Secondary | ICD-10-CM | POA: Diagnosis not present

## 2022-02-20 DIAGNOSIS — D2261 Melanocytic nevi of right upper limb, including shoulder: Secondary | ICD-10-CM | POA: Diagnosis not present

## 2022-02-20 DIAGNOSIS — L570 Actinic keratosis: Secondary | ICD-10-CM | POA: Diagnosis not present

## 2022-02-20 DIAGNOSIS — Z85828 Personal history of other malignant neoplasm of skin: Secondary | ICD-10-CM | POA: Diagnosis not present

## 2022-02-22 DIAGNOSIS — Z20822 Contact with and (suspected) exposure to covid-19: Secondary | ICD-10-CM | POA: Diagnosis not present

## 2022-03-15 DIAGNOSIS — R1310 Dysphagia, unspecified: Secondary | ICD-10-CM | POA: Diagnosis not present

## 2022-03-15 DIAGNOSIS — Z8719 Personal history of other diseases of the digestive system: Secondary | ICD-10-CM | POA: Diagnosis not present

## 2022-05-17 DIAGNOSIS — Z1331 Encounter for screening for depression: Secondary | ICD-10-CM | POA: Diagnosis not present

## 2022-05-17 DIAGNOSIS — E210 Primary hyperparathyroidism: Secondary | ICD-10-CM | POA: Diagnosis not present

## 2022-05-17 DIAGNOSIS — M85851 Other specified disorders of bone density and structure, right thigh: Secondary | ICD-10-CM | POA: Diagnosis not present

## 2022-05-17 DIAGNOSIS — K219 Gastro-esophageal reflux disease without esophagitis: Secondary | ICD-10-CM | POA: Diagnosis not present

## 2022-05-17 DIAGNOSIS — Z0000 Encounter for general adult medical examination without abnormal findings: Secondary | ICD-10-CM | POA: Diagnosis not present

## 2022-05-17 DIAGNOSIS — M85852 Other specified disorders of bone density and structure, left thigh: Secondary | ICD-10-CM | POA: Diagnosis not present

## 2022-05-17 DIAGNOSIS — R32 Unspecified urinary incontinence: Secondary | ICD-10-CM | POA: Diagnosis not present

## 2022-05-29 DIAGNOSIS — Z85828 Personal history of other malignant neoplasm of skin: Secondary | ICD-10-CM | POA: Diagnosis not present

## 2022-05-29 DIAGNOSIS — D692 Other nonthrombocytopenic purpura: Secondary | ICD-10-CM | POA: Diagnosis not present

## 2022-05-29 DIAGNOSIS — L98499 Non-pressure chronic ulcer of skin of other sites with unspecified severity: Secondary | ICD-10-CM | POA: Diagnosis not present

## 2022-08-21 ENCOUNTER — Other Ambulatory Visit (HOSPITAL_BASED_OUTPATIENT_CLINIC_OR_DEPARTMENT_OTHER): Payer: Self-pay

## 2022-08-21 DIAGNOSIS — Z23 Encounter for immunization: Secondary | ICD-10-CM | POA: Diagnosis not present

## 2022-08-21 MED ORDER — FLUAD QUADRIVALENT 0.5 ML IM PRSY
PREFILLED_SYRINGE | INTRAMUSCULAR | 0 refills | Status: AC
  Filled 2022-08-21: qty 0.5, 1d supply, fill #0

## 2022-09-06 DIAGNOSIS — H04123 Dry eye syndrome of bilateral lacrimal glands: Secondary | ICD-10-CM | POA: Diagnosis not present

## 2022-09-06 DIAGNOSIS — H26491 Other secondary cataract, right eye: Secondary | ICD-10-CM | POA: Diagnosis not present

## 2022-09-06 DIAGNOSIS — H3530 Unspecified macular degeneration: Secondary | ICD-10-CM | POA: Diagnosis not present

## 2022-09-06 DIAGNOSIS — H524 Presbyopia: Secondary | ICD-10-CM | POA: Diagnosis not present

## 2022-09-06 DIAGNOSIS — H353131 Nonexudative age-related macular degeneration, bilateral, early dry stage: Secondary | ICD-10-CM | POA: Diagnosis not present

## 2022-09-06 DIAGNOSIS — H53143 Visual discomfort, bilateral: Secondary | ICD-10-CM | POA: Diagnosis not present

## 2022-09-06 DIAGNOSIS — Z9849 Cataract extraction status, unspecified eye: Secondary | ICD-10-CM | POA: Diagnosis not present

## 2022-09-06 DIAGNOSIS — Z961 Presence of intraocular lens: Secondary | ICD-10-CM | POA: Diagnosis not present

## 2022-09-06 DIAGNOSIS — H52223 Regular astigmatism, bilateral: Secondary | ICD-10-CM | POA: Diagnosis not present

## 2022-09-06 DIAGNOSIS — H5213 Myopia, bilateral: Secondary | ICD-10-CM | POA: Diagnosis not present

## 2022-10-18 DIAGNOSIS — Z1231 Encounter for screening mammogram for malignant neoplasm of breast: Secondary | ICD-10-CM | POA: Diagnosis not present

## 2022-11-08 DIAGNOSIS — J069 Acute upper respiratory infection, unspecified: Secondary | ICD-10-CM | POA: Diagnosis not present

## 2022-11-08 DIAGNOSIS — Z03818 Encounter for observation for suspected exposure to other biological agents ruled out: Secondary | ICD-10-CM | POA: Diagnosis not present

## 2022-12-04 DIAGNOSIS — M810 Age-related osteoporosis without current pathological fracture: Secondary | ICD-10-CM | POA: Diagnosis not present

## 2022-12-04 DIAGNOSIS — M8589 Other specified disorders of bone density and structure, multiple sites: Secondary | ICD-10-CM | POA: Diagnosis not present

## 2022-12-04 LAB — HM DEXA SCAN

## 2023-01-08 DIAGNOSIS — L03031 Cellulitis of right toe: Secondary | ICD-10-CM | POA: Diagnosis not present

## 2023-01-08 DIAGNOSIS — L84 Corns and callosities: Secondary | ICD-10-CM | POA: Diagnosis not present

## 2023-01-08 DIAGNOSIS — M2041 Other hammer toe(s) (acquired), right foot: Secondary | ICD-10-CM | POA: Diagnosis not present

## 2023-01-08 DIAGNOSIS — M79671 Pain in right foot: Secondary | ICD-10-CM | POA: Diagnosis not present

## 2023-01-08 DIAGNOSIS — L89892 Pressure ulcer of other site, stage 2: Secondary | ICD-10-CM | POA: Diagnosis not present

## 2023-02-19 DIAGNOSIS — H353131 Nonexudative age-related macular degeneration, bilateral, early dry stage: Secondary | ICD-10-CM | POA: Diagnosis not present

## 2023-02-19 DIAGNOSIS — H02834 Dermatochalasis of left upper eyelid: Secondary | ICD-10-CM | POA: Diagnosis not present

## 2023-02-19 DIAGNOSIS — H18413 Arcus senilis, bilateral: Secondary | ICD-10-CM | POA: Diagnosis not present

## 2023-02-19 DIAGNOSIS — H26491 Other secondary cataract, right eye: Secondary | ICD-10-CM | POA: Diagnosis not present

## 2023-02-27 DIAGNOSIS — H52223 Regular astigmatism, bilateral: Secondary | ICD-10-CM | POA: Diagnosis not present

## 2023-02-27 DIAGNOSIS — H53143 Visual discomfort, bilateral: Secondary | ICD-10-CM | POA: Diagnosis not present

## 2023-02-27 DIAGNOSIS — Z961 Presence of intraocular lens: Secondary | ICD-10-CM | POA: Diagnosis not present

## 2023-02-27 DIAGNOSIS — H524 Presbyopia: Secondary | ICD-10-CM | POA: Diagnosis not present

## 2023-02-27 DIAGNOSIS — H5213 Myopia, bilateral: Secondary | ICD-10-CM | POA: Diagnosis not present

## 2023-04-23 DIAGNOSIS — L570 Actinic keratosis: Secondary | ICD-10-CM | POA: Diagnosis not present

## 2023-04-23 DIAGNOSIS — I8311 Varicose veins of right lower extremity with inflammation: Secondary | ICD-10-CM | POA: Diagnosis not present

## 2023-04-23 DIAGNOSIS — Z85828 Personal history of other malignant neoplasm of skin: Secondary | ICD-10-CM | POA: Diagnosis not present

## 2023-04-23 DIAGNOSIS — I8312 Varicose veins of left lower extremity with inflammation: Secondary | ICD-10-CM | POA: Diagnosis not present

## 2023-04-23 DIAGNOSIS — L821 Other seborrheic keratosis: Secondary | ICD-10-CM | POA: Diagnosis not present

## 2023-04-23 DIAGNOSIS — D692 Other nonthrombocytopenic purpura: Secondary | ICD-10-CM | POA: Diagnosis not present

## 2023-04-23 DIAGNOSIS — I872 Venous insufficiency (chronic) (peripheral): Secondary | ICD-10-CM | POA: Diagnosis not present

## 2023-06-06 DIAGNOSIS — K219 Gastro-esophageal reflux disease without esophagitis: Secondary | ICD-10-CM | POA: Diagnosis not present

## 2023-06-06 DIAGNOSIS — R32 Unspecified urinary incontinence: Secondary | ICD-10-CM | POA: Diagnosis not present

## 2023-06-06 DIAGNOSIS — Z1331 Encounter for screening for depression: Secondary | ICD-10-CM | POA: Diagnosis not present

## 2023-06-06 DIAGNOSIS — S81802D Unspecified open wound, left lower leg, subsequent encounter: Secondary | ICD-10-CM | POA: Diagnosis not present

## 2023-06-06 DIAGNOSIS — T148XXD Other injury of unspecified body region, subsequent encounter: Secondary | ICD-10-CM | POA: Diagnosis not present

## 2023-06-06 DIAGNOSIS — I700 Atherosclerosis of aorta: Secondary | ICD-10-CM | POA: Diagnosis not present

## 2023-06-06 DIAGNOSIS — E210 Primary hyperparathyroidism: Secondary | ICD-10-CM | POA: Diagnosis not present

## 2023-06-06 DIAGNOSIS — Z0000 Encounter for general adult medical examination without abnormal findings: Secondary | ICD-10-CM | POA: Diagnosis not present

## 2023-06-14 DIAGNOSIS — L570 Actinic keratosis: Secondary | ICD-10-CM | POA: Diagnosis not present

## 2023-06-14 DIAGNOSIS — D485 Neoplasm of uncertain behavior of skin: Secondary | ICD-10-CM | POA: Diagnosis not present

## 2023-06-14 DIAGNOSIS — L819 Disorder of pigmentation, unspecified: Secondary | ICD-10-CM | POA: Diagnosis not present

## 2023-06-14 DIAGNOSIS — C44729 Squamous cell carcinoma of skin of left lower limb, including hip: Secondary | ICD-10-CM | POA: Diagnosis not present

## 2023-06-14 DIAGNOSIS — Z85828 Personal history of other malignant neoplasm of skin: Secondary | ICD-10-CM | POA: Diagnosis not present

## 2023-07-28 DIAGNOSIS — Z23 Encounter for immunization: Secondary | ICD-10-CM | POA: Diagnosis not present

## 2023-08-22 DIAGNOSIS — Z23 Encounter for immunization: Secondary | ICD-10-CM | POA: Diagnosis not present

## 2023-09-12 DIAGNOSIS — H26491 Other secondary cataract, right eye: Secondary | ICD-10-CM | POA: Diagnosis not present

## 2023-09-12 DIAGNOSIS — H53143 Visual discomfort, bilateral: Secondary | ICD-10-CM | POA: Diagnosis not present

## 2023-09-12 DIAGNOSIS — H524 Presbyopia: Secondary | ICD-10-CM | POA: Diagnosis not present

## 2023-09-12 DIAGNOSIS — H3530 Unspecified macular degeneration: Secondary | ICD-10-CM | POA: Diagnosis not present

## 2023-09-12 DIAGNOSIS — Z961 Presence of intraocular lens: Secondary | ICD-10-CM | POA: Diagnosis not present

## 2023-09-12 DIAGNOSIS — H52223 Regular astigmatism, bilateral: Secondary | ICD-10-CM | POA: Diagnosis not present

## 2023-09-12 DIAGNOSIS — H353131 Nonexudative age-related macular degeneration, bilateral, early dry stage: Secondary | ICD-10-CM | POA: Diagnosis not present

## 2023-09-12 DIAGNOSIS — H5213 Myopia, bilateral: Secondary | ICD-10-CM | POA: Diagnosis not present

## 2023-09-12 DIAGNOSIS — H04123 Dry eye syndrome of bilateral lacrimal glands: Secondary | ICD-10-CM | POA: Diagnosis not present

## 2023-09-12 DIAGNOSIS — Z9842 Cataract extraction status, left eye: Secondary | ICD-10-CM | POA: Diagnosis not present

## 2023-09-16 DIAGNOSIS — I8312 Varicose veins of left lower extremity with inflammation: Secondary | ICD-10-CM | POA: Diagnosis not present

## 2023-09-16 DIAGNOSIS — Z85828 Personal history of other malignant neoplasm of skin: Secondary | ICD-10-CM | POA: Diagnosis not present

## 2023-10-08 DIAGNOSIS — J209 Acute bronchitis, unspecified: Secondary | ICD-10-CM | POA: Diagnosis not present

## 2023-10-10 DIAGNOSIS — N3941 Urge incontinence: Secondary | ICD-10-CM | POA: Diagnosis not present

## 2023-10-24 DIAGNOSIS — Z1231 Encounter for screening mammogram for malignant neoplasm of breast: Secondary | ICD-10-CM | POA: Diagnosis not present

## 2023-10-24 LAB — HM MAMMOGRAPHY

## 2024-02-18 ENCOUNTER — Encounter: Payer: Self-pay | Admitting: Internal Medicine

## 2024-02-18 ENCOUNTER — Ambulatory Visit: Admitting: Internal Medicine

## 2024-02-18 VITALS — BP 128/78 | HR 71 | Temp 97.2°F | Resp 21 | Ht 63.0 in | Wt 136.0 lb

## 2024-02-18 DIAGNOSIS — N3946 Mixed incontinence: Secondary | ICD-10-CM

## 2024-02-18 DIAGNOSIS — K219 Gastro-esophageal reflux disease without esophagitis: Secondary | ICD-10-CM

## 2024-02-18 DIAGNOSIS — J310 Chronic rhinitis: Secondary | ICD-10-CM | POA: Diagnosis not present

## 2024-02-18 MED ORDER — DICYCLOMINE HCL 10 MG PO CAPS: 10.0000 mg | ORAL_CAPSULE | Freq: Every day | ORAL | Status: AC | PRN

## 2024-02-18 MED ORDER — PANTOPRAZOLE SODIUM 40 MG PO TBEC: 40.0000 mg | DELAYED_RELEASE_TABLET | Freq: Every day | ORAL | Status: DC

## 2024-02-18 NOTE — Progress Notes (Signed)
 Location:   [facility]   Place of Service:   Clinic  Provider:   Code Status: Full Code Goals of Care:     02/18/2024   10:36 AM  Advanced Directives  Does Patient Have a Medical Advance Directive? Yes  Type of Estate agent of [place];Living will;Out of facility DNR (pink MOST or yellow form)  Does patient want to make changes to medical advance directive? No - Patient declined  Copy of Healthcare Power of Attorney in Chart? Yes - validated most recent copy scanned in chart (See row information)  Pre-existing out of facility DNR order (yellow form or pink MOST form) Yellow form placed in chart (order not valid for inpatient use)     Chief Complaint  Patient presents with   New Patient (Initial Visit)    HPI: Patient is a 88 y.o. female seen today for medical management of chronic diseases.   Patient lives in [place] with her husband  Discussed the use of AI scribe software for clinical note transcription with the patient, who gave verbal consent to proceed.  History of Present Illness   The patient, with a history of diverticulitis, fistula, osteoporosis, and GERD, presents with multiple complaints.  The primary concern is urinary incontinence, which has been ongoing despite surgical intervention and medication. The patient reports leakage and occasional loss of control, necessitating the use of pads.  She is on Myrbetriq  The patient also mentions a recent episode of left-sided abdominal pain, which resolved on its own. The patient has a history of diverticulitis and suspects this could be the cause of the pain. She was started on Bentyl but she is not taking it yet as pain has not come back Xray did not show anything Acute  The patient also reports symptoms of GERD, including nightly drainage and morning phlegm. Despite taking pantoprazole, the symptoms persist.  The patient has a history of osteoporosis and has been advised to take calcium and vitamin D  supplements.  The patient also mentions a history of back pain and sciatica, which has been managed with injections in the past. The patient reports a recent episode of severe sciatica, which resolved without further intervention.  The patient also mentions a history of fistula between the rectum and vagina, which required surgical intervention. The patient reports no current issues related to this condition.       Past Medical History:  Diagnosis Date   Anorgasmia of female    Cancer [facility]) 1998   Left breast Lumpectomy, Chemo,Rad   Cataract 2010 2011   DDD (degenerative disc disease)    GERD (gastroesophageal reflux disease) 2000   Osteoporosis     Past Surgical History:  Procedure Laterality Date   ABDOMINAL HYSTERECTOMY  2019   with sling   BREAST SURGERY  3/98   lumpectomy (left) with nodes   BUNIONECTOMY Bilateral 2010   CATARACT EXTRACTION Bilateral 2010 2011   FLEXIBLE SIGMOIDOSCOPY N/A 12/29/2020   Procedure: FLEXIBLE SIGMOIDOSCOPY with propofol;  Surgeon: Kathi Der, MD;  Location: [facility];  Service: Gastroenterology;  Laterality: N/A;   FLEXIBLE SIGMOIDOSCOPY N/A 03/22/2021   Procedure: FLEXIBLE SIGMOIDOSCOPY;  Surgeon: Andria Meuse, MD;  Location: [facility];  Service: General;  Laterality: N/A;   MOHS SURGERY  08/2009   nose, squamous cell ca   SYNOVIAL CYST EXCISION     lumbar   XI ROBOTIC ASSISTED LOWER ANTERIOR RESECTION N/A 03/22/2021   Procedure: XI ROBOTIC ASSISTED LOWER ANTERIOR RESECTION TAKEDOWN OF COLOVAGINAL  FISTULA  AND REPAIR AND BILATERAL TAP BLOCKS;  Surgeon: Andria Meuse, MD;  Location: [facility];  Service: General;  Laterality: N/A;    No Known Allergies  Outpatient Encounter Medications as of 02/18/2024  Medication Sig   Azelaic Acid 15 % cream Apply 1 application topically 2 (two) times daily.   CALCIUM CITRATE-VITAMIN D PO Take 2 tablets by mouth 2 (two) times daily. 1200 mg / 1000 mg slow released   COVID-19 mRNA bivalent  vaccine, Moderna, (MODERNA COVID-19 BIVAL BOOSTER) 50 MCG/0.5ML injection Inject into the muscle.   estradiol (ESTRACE) 0.1 MG/GM vaginal cream Place 1 Applicatorful vaginally 2 (two) times a week.   influenza vaccine adjuvanted (FLUAD QUADRIVALENT) 0.5 ML injection Inject into the muscle.   ipratropium (ATROVENT) 0.06 % nasal spray Place 1-2 sprays into both nostrils daily.   Magnesium 500 MG TABS Take 500 mg by mouth every other day.   mirabegron ER (MYRBETRIQ) 25 MG TB24 tablet Take 25 mg by mouth daily.   Multiple Vitamins-Minerals (ALIVE MULTI-VITAMIN PO) Take 1 tablet by mouth daily.   Omega-3 Fatty Acids (FISH OIL) 1000 MG CAPS Take 1,400 mg by mouth See admin instructions. Take 1000 mg 4 times weekly   saccharomyces boulardii (FLORASTOR) 250 MG capsule Take 250 mg by mouth 3 (three) times a week.   TURMERIC PO Take 1,000 mg by mouth daily.   vitamin B-12 (CYANOCOBALAMIN) 1000 MCG tablet Take 1,000 mcg by mouth daily.   fluticasone (FLONASE) 50 MCG/ACT nasal spray Place 1 spray into both nostrils at bedtime. (Patient not taking: Reported on 02/18/2024)   No facility-administered encounter medications on file as of 02/18/2024.    Review of Systems:  Review of Systems  Constitutional:  Negative for activity change and appetite change.  HENT: Negative.    Respiratory:  Negative for cough and shortness of breath.   Cardiovascular:  Negative for leg swelling.  Gastrointestinal:  Negative for constipation.  Genitourinary:  Positive for frequency.  Musculoskeletal:  Positive for back pain. Negative for arthralgias, gait problem and myalgias.  Skin: Negative.   Neurological:  Negative for dizziness and weakness.  Psychiatric/Behavioral:  Negative for confusion, dysphoric mood and sleep disturbance.     Health Maintenance  Topic Date Due   Pneumonia Vaccine 4+ Years old (2 of 2 - PCV) 12/16/2002   DTaP/Tdap/Td (2 - Tdap) 11/20/2011   COVID-19 Vaccine (3 - Moderna risk series) 09/14/2021    Zoster Vaccines- Shingrix (1 of 2) 03/13/2024 (Originally 12/16/1954)   Medicare Annual Wellness (AWV)  06/05/2024   INFLUENZA VACCINE  06/19/2024   DEXA SCAN  Completed   HPV VACCINES  Aged Out    Physical Exam: Vitals:   02/18/24 1030  BP: 128/78  Pulse: 71  Resp: (!) 21  Temp: (!) 97.2 F (36.2 C)  SpO2: 99%  Weight: 136 lb (61.7 kg)  Height: 5\' 3"  (1.6 m)   Body mass index is 24.09 kg/m. Physical Exam Vitals reviewed.  Constitutional:      Appearance: Normal appearance.  HENT:     Head: Normocephalic.     Nose: Nose normal.     Mouth/Throat:     Mouth: Mucous membranes are moist.     Pharynx: Oropharynx is clear.  Eyes:     Pupils: Pupils are equal, round, and reactive to light.  Cardiovascular:     Rate and Rhythm: Normal rate and regular rhythm.     Pulses: Normal pulses.     Heart sounds: Normal heart sounds.  No murmur heard. Pulmonary:     Effort: Pulmonary effort is normal.     Breath sounds: Normal breath sounds.  Abdominal:     General: Abdomen is flat. Bowel sounds are normal.     Palpations: Abdomen is soft.  Musculoskeletal:        General: No swelling.     Cervical back: Neck supple.  Skin:    General: Skin is warm.  Neurological:     General: No focal deficit present.     Mental Status: She is alert and oriented to person, place, and time.  Psychiatric:        Mood and Affect: Mood normal.        Thought Content: Thought content normal.     Labs reviewed: Basic Metabolic Panel: No results for input(s): "NA", "K", "CL", "CO2", "GLUCOSE", "BUN", "CREATININE", "CALCIUM", "MG", "PHOS", "TSH" in the last 8760 hours. Liver Function Tests: No results for input(s): "AST", "ALT", "ALKPHOS", "BILITOT", "PROT", "ALBUMIN" in the last 8760 hours. No results for input(s): "LIPASE", "AMYLASE" in the last 8760 hours. No results for input(s): "AMMONIA" in the last 8760 hours. CBC: No results for input(s): "WBC", "NEUTROABS", "HGB", "HCT", "MCV", "PLT"  in the last 8760 hours. Lipid Panel: No results for input(s): "CHOL", "HDL", "LDLCALC", "TRIG", "CHOLHDL", "LDLDIRECT" in the last 8760 hours. No results found for: "HGBA1C"  Procedures since last visit: No results found.  Assessment/Plan Assessment and Plan    Urinary Incontinence Chronic urinary incontinence with previous surgical intervention (hammock procedure) and Myrbetriq prescription. Reports some improvement but continues to experience leakage and occasional loss of control. Uses pads daily and experiences increased frequency of urination in the morning. Estrace prescribed to help build pelvic muscles and prevent infections. 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 No current issues reported post-repair.  Diverticulitis Diverticulitis with previous colon resection. Recent left-sided abdominal pain managed with heat and massage. No recent pain episodes in the past month. Previous blood tests showed no infection or celiac disease.  . Dicyclomine prescribed for pain management during flare-ups.   Gastroesophageal Reflux Disease (GERD) Chronic GERD managed with pantoprazole 40 mg daily. Reports persistent drainage and phlegm production, especially at night and in the morning.  Flonase suggested to manage symptoms. - Continue pantoprazole 40 mg daily - Trial of Flonase, one spray instead of two, to manage drainage and phlegm  Hypertension Blood pressure readings have been variable, with recent measurements ranging from 136/xx to 160/xx. Current reading is 140/xx, which is acceptable for her age according to previous provider's guidance.  Osteoporosis Osteoporosis with previous bone density monitoring. No current treatment. Plan to obtain report from [facility] to evaluate current status and consider  treatment options. - Obtain bone density report from [facility]   Chronic Back Pain Chronic back pain with previous L4-L5 injections. Managing well with current regimen. Considering physical therapy for posture improvement and back pain management. - Consider physical therapy referral for posture improvement and back pain management  Follow-up Follow-up needed to review test results and ongoing management of chronic conditions. Physical therapy referral to be made for posture and pelvic floor exercises. - Schedule follow-up appointment in four weeks - Make physical therapy referral for posture and pelvic floor exercises         Labs/tests ordered:  * No order type specified * Next appt:  Visit date not found

## 2024-02-20 ENCOUNTER — Encounter: Payer: Self-pay | Admitting: Internal Medicine

## 2024-03-09 ENCOUNTER — Other Ambulatory Visit: Payer: Self-pay | Admitting: Internal Medicine

## 2024-03-09 DIAGNOSIS — K219 Gastro-esophageal reflux disease without esophagitis: Secondary | ICD-10-CM

## 2024-03-17 ENCOUNTER — Non-Acute Institutional Stay: Admitting: Internal Medicine

## 2024-03-17 ENCOUNTER — Encounter: Payer: Self-pay | Admitting: Internal Medicine

## 2024-03-17 VITALS — HR 70 | Temp 98.0°F | Resp 21 | Ht 63.0 in | Wt 135.4 lb

## 2024-03-17 DIAGNOSIS — J310 Chronic rhinitis: Secondary | ICD-10-CM

## 2024-03-17 DIAGNOSIS — R0989 Other specified symptoms and signs involving the circulatory and respiratory systems: Secondary | ICD-10-CM

## 2024-03-17 DIAGNOSIS — K219 Gastro-esophageal reflux disease without esophagitis: Secondary | ICD-10-CM | POA: Diagnosis not present

## 2024-03-17 DIAGNOSIS — N3946 Mixed incontinence: Secondary | ICD-10-CM

## 2024-03-17 NOTE — Progress Notes (Signed)
 Location:  [facility] features editor of Service:  Clinic (12)  Provider:   Code Status:  Goals of Care:     02/18/2024   10:36 AM  Advanced Directives  Does Patient Have a Medical Advance Directive? Yes  Type of Estate agent of [name];Living will;Out of facility DNR (pink MOST or yellow form)  Does patient want to make changes to medical advance directive? No - Patient declined  Copy of Healthcare Power of Attorney in Chart? Yes - validated most recent copy scanned in chart (See row information)  Pre-existing out of facility DNR order (yellow form or pink MOST form) Yellow form placed in chart (order not valid for inpatient use)     Chief Complaint  Patient presents with   Follow-up     4 week follow up. Patient wanted to Discuss PT    HPI: Patient is a 88 y.o. female seen today for medical management of chronic diseases.   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 Camilo Cella  OAB, UUI: on mirabegron Follows with Dr Alla Isaacs   Discussed the use of AI scribe software for clinical note transcription with the patient, who gave verbal consent to proceed.  History of Present Illness   Jessica Mcdonald is an 88 year old female with urinary incontinence and diverticulosis who presents for follow-up on urinary symptoms and abdominal pain.  Urinary Incontinence She experiences ongoing urinary incontinence, managed with pads, using a level two pad during the day and a level four pad at night. She takes Myrbetriq 25 mg but is considering discontinuing it due to cost and uncertain efficacy. She avoids accidents if she reaches the toilet in time and limits fluid intake. Estrace cream is used twice a week to help prevent infections. Sees Dr Alla Isaacs who offered her more Intervention Botox / PNE She refused for now IBS with Left Abdominal Pain Does have h/o  Diverticulitis Intermittent left abdominal pain is described as intense and sporadic, with two recent episodes but no recurrence since. She has diverticulosis and underwent a sigmoid colon resection for a rectovaginal fistula. Bentyl  is used occasionally for pain relief, along with massage and heat application. A CT scan and flexible sigmoidoscopy were previously performed.  She has GERD and takes pantoprazole , with recent reflux episodes after consuming certain foods.  Chronic Rhinitis Despite using a nasal spray, she experiences throat clearing and phlegm production.  She has arthritis in her lower back and a history of severe stenosis pain. She is interested in physical therapy for gait issues and works with a massage therapist to improve posture. She takes calcium with vitamin D and a vitamin D3 supplement, with reduced calcium intake due to high blood calcium levels. She is aware of her bone density status and is due for a bone density test next year.       Past Medical History:  Diagnosis Date   Anorgasmia of female    Cancer [facility]) 1998   Left breast Lumpectomy, Chemo,Rad   Cataract 2010 2011   DDD (degenerative disc disease)    GERD (gastroesophageal reflux disease) 2000   Osteoporosis     Past Surgical History:  Procedure Laterality Date   ABDOMINAL HYSTERECTOMY  2019   with sling   BREAST SURGERY  3/98   lumpectomy (left) with nodes   BUNIONECTOMY Bilateral 2010   CATARACT EXTRACTION Bilateral 2010 2011   FLEXIBLE SIGMOIDOSCOPY  N/A 12/29/2020   Procedure: FLEXIBLE SIGMOIDOSCOPY with propofol ;  Surgeon: Felecia Hopper, MD;  Location: [facility];  Service: Gastroenterology;  Laterality: N/A;   FLEXIBLE SIGMOIDOSCOPY N/A 03/22/2021   Procedure: FLEXIBLE SIGMOIDOSCOPY;  Surgeon: Melvenia Stabs, MD;  Location: [facility];  Service: General;  Laterality: N/A;   MOHS SURGERY  08/2009   nose, squamous cell ca   SYNOVIAL CYST EXCISION     lumbar   XI ROBOTIC ASSISTED LOWER  ANTERIOR RESECTION N/A 03/22/2021   Procedure: XI ROBOTIC ASSISTED LOWER ANTERIOR RESECTION TAKEDOWN OF COLOVAGINAL  FISTULA  AND REPAIR AND BILATERAL TAP BLOCKS;  Surgeon: Melvenia Stabs, MD;  Location: [facility];  Service: General;  Laterality: N/A;    No Known Allergies  Outpatient Encounter Medications as of 03/17/2024  Medication Sig   Azelaic Acid 15 % cream Apply 1 application topically 2 (two) times daily.   CALCIUM CITRATE-VITAMIN D PO Take 2 tablets by mouth 2 (two) times daily. 1200 mg / 1000 mg slow released   conjugated estrogens (PREMARIN) vaginal cream Place 1 applicator vaginally 2 (two) times a week. Use as directed vaginally twice a week ,30,30 gram   COVID-19 mRNA bivalent vaccine, Moderna, (MODERNA COVID-19 BIVAL BOOSTER) 50 MCG/0.5ML injection Inject into the muscle.   dicyclomine  (BENTYL ) 10 MG capsule Take 1 capsule (10 mg total) by mouth daily as needed for spasms.   estradiol (ESTRACE) 0.1 MG/GM vaginal cream Place 1 Applicatorful vaginally 2 (two) times a week.   fluticasone  (FLONASE ) 50 MCG/ACT nasal spray Place 1 spray into both nostrils at bedtime.   influenza vaccine adjuvanted (FLUAD QUADRIVALENT ) 0.5 ML injection Inject into the muscle.   ipratropium (ATROVENT ) 0.06 % nasal spray Place 1-2 sprays into both nostrils daily.   Magnesium 500 MG TABS Take 500 mg by mouth every other day.   mirabegron ER (MYRBETRIQ) 25 MG TB24 tablet Take 25 mg by mouth daily.   Multiple Vitamins-Minerals (ALIVE MULTI-VITAMIN PO) Take 1 tablet by mouth daily.   Omega-3 Fatty Acids (FISH OIL) 1000 MG CAPS Take 1,400 mg by mouth See admin instructions. Take 1000 mg 4 times weekly   pantoprazole  (PROTONIX ) 40 MG tablet TAKE ONE TABLET BY MOUTH ONCE DAILY.   saccharomyces boulardii (FLORASTOR) 250 MG capsule Take 250 mg by mouth 3 (three) times a week.   triamcinolone cream (KENALOG) 0.1 % Apply 1 Application topically 2 (two) times daily.   TURMERIC PO Take 1,000 mg by mouth daily.    vitamin B-12 (CYANOCOBALAMIN) 1000 MCG tablet Take 1,000 mcg by mouth daily.   No facility-administered encounter medications on file as of 03/17/2024.    Review of Systems:  Review of Systems  Constitutional:  Negative for activity change and appetite change.  HENT:  Positive for postnasal drip, rhinorrhea and voice change.   Respiratory:  Negative for cough and shortness of breath.   Cardiovascular:  Negative for leg swelling.  Gastrointestinal:  Negative for constipation.  Genitourinary:  Positive for frequency.  Musculoskeletal:  Positive for arthralgias. Negative for gait problem and myalgias.  Skin: Negative.   Neurological:  Negative for dizziness and weakness.  Psychiatric/Behavioral:  Negative for confusion, dysphoric mood and sleep disturbance.     Health Maintenance  Topic Date Due   Zoster Vaccines- Shingrix (1 of 2) 12/16/1954   Pneumonia Vaccine 6+ Years old (2 of 2 - PCV) 11/19/2000   DTaP/Tdap/Td (3 - Td or Tdap) 11/20/2011   COVID-19 Vaccine (7 - Moderna risk 2024-25 season) 08/14/2024 (Originally 01/25/2024)  Medicare Annual Wellness (AWV)  06/05/2024   INFLUENZA VACCINE  06/19/2024   DEXA SCAN  Completed   HPV VACCINES  Aged Out   Meningococcal B Vaccine  Aged Out    Physical Exam: Vitals:   03/17/24 0907  Pulse: 70  Resp: (!) 21  Temp: 98 F (36.7 C)  SpO2: 98%  Weight: 135 lb 6.4 oz (61.4 kg)  Height: 5\' 3"  (1.6 m)   Body mass index is 23.99 kg/m. Physical Exam Vitals reviewed.  Constitutional:      Appearance: Normal appearance.  HENT:     Head: Normocephalic.     Nose: Nose normal.     Mouth/Throat:     Mouth: Mucous membranes are moist.     Pharynx: Oropharynx is clear.  Eyes:     Pupils: Pupils are equal, round, and reactive to light.  Cardiovascular:     Rate and Rhythm: Normal rate and regular rhythm.     Pulses: Normal pulses.     Heart sounds: Normal heart sounds. No murmur heard. Pulmonary:     Effort: Pulmonary effort is  normal.     Breath sounds: Normal breath sounds.  Abdominal:     General: Abdomen is flat. Bowel sounds are normal.     Palpations: Abdomen is soft.  Musculoskeletal:        General: No swelling.     Cervical back: Neck supple.  Skin:    General: Skin is warm.  Neurological:     General: No focal deficit present.     Mental Status: She is alert and oriented to person, place, and time.  Psychiatric:        Mood and Affect: Mood normal.        Thought Content: Thought content normal.     Labs reviewed: Basic Metabolic Panel: No results for input(s): "NA", "K", "CL", "CO2", "GLUCOSE", "BUN", "CREATININE", "CALCIUM", "MG", "PHOS", "TSH" in the last 8760 hours. Liver Function Tests: No results for input(s): "AST", "ALT", "ALKPHOS", "BILITOT", "PROT", "ALBUMIN" in the last 8760 hours. No results for input(s): "LIPASE", "AMYLASE" in the last 8760 hours. No results for input(s): "AMMONIA" in the last 8760 hours. CBC: No results for input(s): "WBC", "NEUTROABS", "HGB", "HCT", "MCV", "PLT" in the last 8760 hours. Lipid Panel: No results for input(s): "CHOL", "HDL", "LDLCALC", "TRIG", "CHOLHDL", "LDLDIRECT" in the last 8760 hours. No results found for: "HGBA1C"  Procedures since last visit: No results found.  Assessment/Plan 1. Chronic rhinitis (Primary) ENT referal  2. Chronic throat clearing ENT Referal  3. Gastroesophageal reflux disease without esophagitis Protonix   4. Mixed stress and urge urinary incontinence Chronic urinary incontinence managed with Myrbetriq and Estrace. Discussed discontinuing Myrbetriq due to cost and efficacy concerns. Estrace used to prevent infections. Can Stop Myrbetriq to see if it is worth the cost for now Can restart if symptoms are worse - Continue Estrace twice weekly. - Encourage pelvic floor exercises, 20-25 times while sitting.     5 Arthritis Chronic arthritis with severe stenosis pain affecting gait and posture. - Refer to [facility] for physical therapy.  6 Diverticulosis Diverticulosis with intermittent left abdominal pain, possibly due to diverticulitis or gas-related spasms. Bentyl  prescribed for pain management. - Use Bentyl  (dicyclomine ) as needed. - Consider CT scan if pain persists. - Refer back to GI if necessary.    Labs/tests ordered:  Labs to be ordered Next appt:  Visit date not found

## 2024-03-26 ENCOUNTER — Encounter (INDEPENDENT_AMBULATORY_CARE_PROVIDER_SITE_OTHER): Payer: Self-pay | Admitting: Otolaryngology

## 2024-03-26 LAB — BASIC METABOLIC PANEL WITH GFR
BUN: 13 (ref 4–21)
BUN: 13 (ref 4–21)
CO2: 25 — AB (ref 13–22)
Chloride: 103 (ref 99–108)
Creatinine: 0.7 (ref 0.5–1.1)
Creatinine: 0.7 (ref 0.5–1.1)
Glucose: 105
Glucose: 105
Potassium: 4.4 meq/L (ref 3.5–5.1)
Sodium: 140 (ref 137–147)
Sodium: 140 (ref 137–147)

## 2024-03-26 LAB — CBC AND DIFFERENTIAL
HCT: 43 (ref 36–46)
Hemoglobin: 14.2 (ref 12.0–16.0)
Platelets: 298 10*3/uL (ref 150–400)
WBC: 4.1

## 2024-03-26 LAB — COMPREHENSIVE METABOLIC PANEL WITH GFR
Albumin: 4.3 (ref 3.5–5.0)
Calcium: 11.3 — AB (ref 8.7–10.7)
Calcium: 11.3 — AB (ref 8.7–10.7)
Globulin: 2.6
eGFR: 82

## 2024-03-26 LAB — VITAMIN D 25 HYDROXY (VIT D DEFICIENCY, FRACTURES): Vit D, 25-Hydroxy: 41.1

## 2024-03-26 LAB — HEPATIC FUNCTION PANEL
ALT: 24 U/L (ref 7–35)
AST: 27 (ref 13–35)
Alkaline Phosphatase: 62 (ref 25–125)
Bilirubin, Total: 0.4

## 2024-03-26 LAB — LIPID PANEL
Cholesterol: 234 — AB (ref 0–200)
HDL: 127 — AB (ref 35–70)
LDL Cholesterol: 95
LDl/HDL Ratio: 1.8
Triglycerides: 62 (ref 40–160)

## 2024-03-26 LAB — CBC: RBC: 4.46 (ref 3.87–5.11)

## 2024-03-26 LAB — TSH: TSH: 2.32 (ref 0.41–5.90)

## 2024-04-28 ENCOUNTER — Encounter: Payer: Self-pay | Admitting: Internal Medicine

## 2024-04-28 ENCOUNTER — Non-Acute Institutional Stay: Admitting: Internal Medicine

## 2024-04-28 VITALS — BP 110/70 | HR 76 | Temp 98.0°F | Ht 63.0 in | Wt 133.6 lb

## 2024-04-28 DIAGNOSIS — R1032 Left lower quadrant pain: Secondary | ICD-10-CM

## 2024-04-28 DIAGNOSIS — R1084 Generalized abdominal pain: Secondary | ICD-10-CM

## 2024-04-28 DIAGNOSIS — N3946 Mixed incontinence: Secondary | ICD-10-CM | POA: Diagnosis not present

## 2024-04-28 DIAGNOSIS — K219 Gastro-esophageal reflux disease without esophagitis: Secondary | ICD-10-CM

## 2024-04-28 DIAGNOSIS — J310 Chronic rhinitis: Secondary | ICD-10-CM

## 2024-04-28 DIAGNOSIS — G8929 Other chronic pain: Secondary | ICD-10-CM

## 2024-04-28 MED ORDER — VITAMIN D3 25 MCG (1000 UT) PO CAPS: 1000.0000 [IU] | ORAL_CAPSULE | Freq: Every day | ORAL | Status: DC

## 2024-04-28 NOTE — Progress Notes (Signed)
 Location: [facility] features editor of Service:  Clinic (12)  Provider:   Code Status:  Goals of Care:     02/18/2024   10:36 AM  Advanced Directives  Does Patient Have a Medical Advance Directive? Yes  Type of Estate agent of [place];Living will;Out of facility DNR (pink MOST or yellow form)  Does patient want to make changes to medical advance directive? No - Patient declined  Copy of Healthcare Power of Attorney in Chart? Yes - validated most recent copy scanned in chart (See row information)  Pre-existing out of facility DNR order (yellow form or pink MOST form) Yellow form placed in chart (order not valid for inpatient use)     Chief Complaint  Patient presents with   Follow-up    6 week follow up. Discuss the need for Shingles and Pneumonia    HPI: Patient is a 88 y.o. 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 Camilo Cella 2022 OAB, UUI: on mirabegron Follows with Dr Alla Isaacs She is s/p TVH BSO USS  and TVT sling repair in 2020 For UV Prolapse and Stress Incontinence  IBS with Left Abdominal Pain Does have h/o Diverticulitis  Discussed the use of AI scribe software for clinical note transcription with the patient, who gave verbal consent to proceed.  Left Abdominal Pain  She experiences left-sided abdominal pain associated with gas, relieved by medication, heat, and massage. Bentyl  effectively manages her symptoms. She uses Citrucel for regular bowel movements and is satisfied with its effectiveness.   She continues Myrbetriq for urinary issues, which helps prevent accidents, and uses Estrace intermittently for urinary control. Kegel exercises are part of her routine to strengthen pelvic muscles.         Past Medical History:  Diagnosis Date   Anorgasmia of  female    Cancer [facility]) 1998   Left breast Lumpectomy, Chemo,Rad   Cataract 2010 2011   DDD (degenerative disc disease)    GERD (gastroesophageal reflux disease) 2000   Osteoporosis     Past Surgical History:  Procedure Laterality Date   ABDOMINAL HYSTERECTOMY  2019   with sling   BREAST SURGERY  3/98   lumpectomy (left) with nodes   BUNIONECTOMY Bilateral 2010   CATARACT EXTRACTION Bilateral 2010 2011   FLEXIBLE SIGMOIDOSCOPY N/A 12/29/2020   Procedure: FLEXIBLE SIGMOIDOSCOPY with propofol ;  Surgeon: Felecia Hopper, MD;  Location: [facility];  Service: Gastroenterology;  Laterality: N/A;   FLEXIBLE SIGMOIDOSCOPY N/A 03/22/2021   Procedure: FLEXIBLE SIGMOIDOSCOPY;  Surgeon: Melvenia Stabs, MD;  Location: [facility];  Service: General;  Laterality: N/A;   MOHS SURGERY  08/2009   nose, squamous cell ca   SYNOVIAL CYST EXCISION     lumbar   XI ROBOTIC ASSISTED LOWER ANTERIOR RESECTION N/A 03/22/2021   Procedure: XI ROBOTIC ASSISTED LOWER ANTERIOR RESECTION TAKEDOWN OF COLOVAGINAL  FISTULA  AND REPAIR AND BILATERAL TAP BLOCKS;  Surgeon: Melvenia Stabs, MD;  Location: [facility];  Service: General;  Laterality: N/A;    No Known Allergies  Outpatient Encounter Medications as of 04/28/2024  Medication Sig   Azelaic Acid 15 % cream Apply 1 application topically 2 (two) times daily.   Cholecalciferol (VITAMIN D3) 25 MCG (1000 UT) CAPS Take 1 capsule (1,000 Units total) by mouth daily.  COVID-19 mRNA bivalent vaccine, Moderna, (MODERNA COVID-19 BIVAL BOOSTER) 50 MCG/0.5ML injection Inject into the muscle.   dicyclomine  (BENTYL ) 10 MG capsule Take 1 capsule (10 mg total) by mouth daily as needed for spasms.   estradiol (ESTRACE) 0.1 MG/GM vaginal cream Place 1 Applicatorful vaginally 2 (two) times a week.   fluticasone  (FLONASE ) 50 MCG/ACT nasal spray Place 1 spray into both nostrils at bedtime.   influenza vaccine adjuvanted (FLUAD  QUADRIVALENT) 0.5 ML injection Inject into the  muscle.   ipratropium (ATROVENT ) 0.06 % nasal spray Place 1-2 sprays into both nostrils daily.   Magnesium 500 MG TABS Take 500 mg by mouth every other day.   mirabegron ER (MYRBETRIQ) 25 MG TB24 tablet Take 25 mg by mouth daily.   Multiple Vitamins-Minerals (ALIVE MULTI-VITAMIN PO) Take 1 tablet by mouth daily.   Omega-3 Fatty Acids (FISH OIL) 1000 MG CAPS Take 1,400 mg by mouth See admin instructions. Take 1000 mg 4 times weekly   pantoprazole  (PROTONIX ) 40 MG tablet TAKE ONE TABLET BY MOUTH ONCE DAILY.   saccharomyces boulardii (FLORASTOR) 250 MG capsule Take 250 mg by mouth 3 (three) times a week.   triamcinolone cream (KENALOG) 0.1 % Apply 1 Application topically 2 (two) times daily.   TURMERIC PO Take 1,000 mg by mouth daily.   vitamin B-12 (CYANOCOBALAMIN) 1000 MCG tablet Take 1,000 mcg by mouth daily.   [DISCONTINUED] CALCIUM CITRATE-VITAMIN D  PO Take 1 tablet by mouth daily. 1200 mg / 1000 mg slow released   [DISCONTINUED] conjugated estrogens (PREMARIN) vaginal cream Place 1 applicator vaginally 2 (two) times a week. Use as directed vaginally twice a week ,30,30 gram   No facility-administered encounter medications on file as of 04/28/2024.    Review of Systems:  Review of Systems  Health Maintenance  Topic Date Due   Zoster Vaccines- Shingrix (1 of 2) 12/16/1954   Pneumococcal Vaccine: 50+ Years (3 of 3 - PCV20 or PCV21) 10/05/2019   Medicare Annual Wellness (AWV)  06/05/2024   COVID-19 Vaccine (7 - Moderna risk 2024-25 season) 08/14/2024 (Originally 01/25/2024)   DTaP/Tdap/Td (3 - Td or Tdap) 04/28/2025 (Originally 11/20/2011)   INFLUENZA VACCINE  06/19/2024   DEXA SCAN  Completed   HPV VACCINES  Aged Out   Meningococcal B Vaccine  Aged Out    Physical Exam: Vitals:   04/28/24 1008  BP: 110/70  Pulse: 76  Temp: 98 F (36.7 C)  SpO2: 96%  Weight: 133 lb 9.6 oz (60.6 kg)  Height: 5' 3 (1.6 m)   Body mass index is 23.67 kg/m. Physical Exam  Labs reviewed: Basic  Metabolic Panel: Recent Labs    03/26/24 0000  NA 140  140  K 4.4  CL 103  CO2 25*  BUN 13  13  CREATININE 0.7  0.7  CALCIUM 11.3*  11.3*  TSH 2.32   Liver Function Tests: Recent Labs    03/26/24 0000  AST 27  ALT 24  ALKPHOS 62  ALBUMIN 4.3   No results for input(s): LIPASE, AMYLASE in the last 8760 hours. No results for input(s): AMMONIA in the last 8760 hours. CBC: Recent Labs    03/26/24 0000  WBC 4.1  HGB 14.2  HCT 43  PLT 298   Lipid Panel: Recent Labs    03/26/24 0000  CHOL 234*  HDL 127*  LDLCALC 95  TRIG 62   No results found for: HGBA1C  Procedures since last visit: No results found.  Assessment/Plan 1. Generalized abdominal pain (Primary)  -  CT ABDOMEN PELVIS W CONTRAST; Future  2. Chronic LLQ pain Gets relief with Bentyl  Does have h/o Diverticulosis  S/p Colovaginal Fistula   Will repeat CT scan   3. Serum calcium elevated Discontinue PO calcium Continue Vit D for now Will recheck her Calcium in 3 months  4. Mixed stress and urge urinary incontinence Managed with Myrbetriq and Estrace. Reports more accidents without Myrbetriq. Estrace may aid urinary control. - Continue Myrbetriq. - Continue Estrace as prescribed. - Encourage regular Kegel exercises.  Also Sees Urology  5. Gastroesophageal reflux disease without esophagitis Pantoprazole  and Diet   6. Chronic rhinitis Flonase  and Atrovent  Has Appointment with ENT   7 Osteopenia Osteopenia with risk of progression to osteoporosis. Bone density screening due next year. - Schedule bone density screening next year.    Labs/tests ordered:  Labs Ordered Next appt:  Visit date not found

## 2024-05-05 ENCOUNTER — Other Ambulatory Visit (HOSPITAL_BASED_OUTPATIENT_CLINIC_OR_DEPARTMENT_OTHER): Payer: Self-pay

## 2024-05-05 MED ORDER — CAPVAXIVE 0.5 ML IM SOSY
0.5000 mL | PREFILLED_SYRINGE | Freq: Once | INTRAMUSCULAR | 0 refills | Status: AC
  Filled 2024-05-05: qty 0.5, 1d supply, fill #0

## 2024-05-13 ENCOUNTER — Ambulatory Visit (HOSPITAL_BASED_OUTPATIENT_CLINIC_OR_DEPARTMENT_OTHER)
Admission: RE | Admit: 2024-05-13 | Discharge: 2024-05-13 | Disposition: A | Discharge status: Home / Self Care | Source: Ambulatory Visit | Attending: Internal Medicine | Admitting: Internal Medicine

## 2024-05-13 DIAGNOSIS — R1084 Generalized abdominal pain: Secondary | ICD-10-CM | POA: Insufficient documentation

## 2024-05-13 MED ORDER — IOHEXOL 300 MG/ML  SOLN
75.0000 mL | Freq: Once | INTRAMUSCULAR | Status: AC | PRN
  Administered 2024-05-13: 75 mL via INTRAVENOUS

## 2024-05-14 ENCOUNTER — Ambulatory Visit: Payer: Self-pay | Admitting: Internal Medicine

## 2024-05-28 ENCOUNTER — Telehealth: Payer: Self-pay

## 2024-05-28 ENCOUNTER — Ambulatory Visit: Admitting: Adult Health

## 2024-05-28 ENCOUNTER — Encounter: Payer: Self-pay | Admitting: Adult Health

## 2024-05-28 VITALS — BP 138/82 | HR 86 | Temp 96.8°F | Resp 10 | Ht 63.0 in | Wt 135.8 lb

## 2024-05-28 DIAGNOSIS — N3281 Overactive bladder: Secondary | ICD-10-CM

## 2024-05-28 DIAGNOSIS — R300 Dysuria: Secondary | ICD-10-CM

## 2024-05-28 DIAGNOSIS — R350 Frequency of micturition: Secondary | ICD-10-CM

## 2024-05-28 DIAGNOSIS — N390 Urinary tract infection, site not specified: Secondary | ICD-10-CM | POA: Diagnosis not present

## 2024-05-28 DIAGNOSIS — K219 Gastro-esophageal reflux disease without esophagitis: Secondary | ICD-10-CM

## 2024-05-28 LAB — POCT URINALYSIS DIPSTICK
Appearance: UNDETERMINED
Bilirubin, UA: NEGATIVE
Clarity, UA: UNDETERMINED
Glucose, UA: NEGATIVE
Ketones, UA: POSITIVE — AB
Nitrite, UA: POSITIVE — AB
Protein, UA: NEGATIVE
Spec Grav, UA: 1.01 (ref 1.010–1.025)
Urobilinogen, UA: >=8 U/dL — AB
pH, UA: 6.5 (ref 5.0–8.0)

## 2024-05-28 MED ORDER — NITROFURANTOIN MONOHYD MACRO 100 MG PO CAPS: 100.0000 mg | ORAL_CAPSULE | Freq: Two times a day (BID) | ORAL | 0 refills | Status: AC

## 2024-05-28 NOTE — Telephone Encounter (Signed)
 Patient walked into the office with urine specimen requesting that she has an antibiotic prescribed. She states that Dr.Gupta is her new provider and she thought that she could just come into the office and drop off urine specimen. There is no documentation of PCP Charlanne Fredia CROME, MD advising patient to do this. Patient expressed that her previous practice would let her do this. I explained to her that we can give her the proper supplies for another urine specimen and we can have her scheduled either virtually or in person to be seen for this concern. Patient complained of urinary frequency and dysuria. I gave patient supplies and sent her to the restroom. She was unable to give specimen and states that she would rather have her virtual visit turned into and in person visit so she can come back and provide specimen. Message sent to Jereld Delude, NP and Medical Assistant assigned with provider for the day Glenvil.G/CMA as RICK. Patient has appointment at 1:40pm.

## 2024-05-28 NOTE — Progress Notes (Signed)
 [facility] clinic  Provider:  Jereld Serum DNP  Code Status:  Full Code  Goals of Care:     02/18/2024   10:36 AM  Advanced Directives  Does Patient Have a Medical Advance Directive? Yes  Type of Estate agent of [place];Living will;Out of facility DNR (pink MOST or yellow form)  Does patient want to make changes to medical advance directive? No - Patient declined  Copy of Healthcare Power of Attorney in Chart? Yes - validated most recent copy scanned in chart (See row information)  Pre-existing out of facility DNR order (yellow form or pink MOST form) Yellow form placed in chart (order not valid for inpatient use)     Chief Complaint  Patient presents with   Dysuria    Patient complains of dysuria, and urinary frequency.     Discussed the use of AI scribe software for clinical note transcription with the patient, who gave verbal consent to proceed.  HPI: Patient is a 88 y.o. female seen today for an acute visit for dysuria and urinary frequency.  She began experiencing symptoms of a urinary tract infection this morning, including dysuria and urgency. Initially, there was no hematuria, but she later noticed a small spot of blood. She has been unable to provide a urine sample due to recently voiding.  She has a history of overactive bladder, managed with Myrbetriq 25 mg daily. She underwent surgical repair of a fistula between her colon and bladder, associated with diverticulitis, and has not experienced urinary tract infections since the surgery until now.  Her medication regimen includes Protonix  40 mg daily for GERD, which she describes as 'silent GERD' with symptoms of rhinorrhea and mucus in the esophagus. She uses Flonase  for nasal symptoms, which helps reduce nasal discharge. She also takes supplements including B12, turmeric, fish oil, multivitamins, magnesium, and vitamin D3.  She maintains an active lifestyle, engaging in Pilates, yoga, and weight  training. She uses azelaic acid cream for rosacea and estradiol cream as prescribed.    Past Medical History:  Diagnosis Date   Anorgasmia of female    Cancer [facility]) 1998   Left breast Lumpectomy, Chemo,Rad   Cataract 2010 2011   DDD (degenerative disc disease)    GERD (gastroesophageal reflux disease) 2000   Osteoporosis     Past Surgical History:  Procedure Laterality Date   ABDOMINAL HYSTERECTOMY  2019   with sling   BREAST SURGERY  3/98   lumpectomy (left) with nodes   BUNIONECTOMY Bilateral 2010   CATARACT EXTRACTION Bilateral 2010 2011   FLEXIBLE SIGMOIDOSCOPY N/A 12/29/2020   Procedure: FLEXIBLE SIGMOIDOSCOPY with propofol ;  Surgeon: Elicia Claw, MD;  [facility]: [facility];  Service: Gastroenterology;  Laterality: N/A;   FLEXIBLE SIGMOIDOSCOPY N/A 03/22/2021   Procedure: FLEXIBLE SIGMOIDOSCOPY;  Surgeon: Teresa Lonni HERO, MD;  Location: [facility];  Service: General;  Laterality: N/A;   MOHS SURGERY  08/2009   nose, squamous cell ca   SYNOVIAL CYST EXCISION     lumbar   XI ROBOTIC ASSISTED LOWER ANTERIOR RESECTION N/A 03/22/2021   Procedure: XI ROBOTIC ASSISTED LOWER ANTERIOR RESECTION TAKEDOWN OF COLOVAGINAL  FISTULA  AND REPAIR AND BILATERAL TAP BLOCKS;  Surgeon: Teresa Lonni HERO, MD;  Location: [facility];  Service: General;  Laterality: N/A;    No Known Allergies  Outpatient Encounter Medications as of 05/28/2024  Medication Sig   Azelaic Acid 15 % cream Apply 1 application topically 2 (two) times daily.   Cholecalciferol (VITAMIN D3) 25 MCG (  1000 UT) CAPS Take 1 capsule (1,000 Units total) by mouth daily.   COVID-19 mRNA bivalent vaccine, Moderna, (MODERNA COVID-19 BIVAL BOOSTER) 50 MCG/0.5ML injection Inject into the muscle.   dicyclomine  (BENTYL ) 10 MG capsule Take 1 capsule (10 mg total) by mouth daily as needed for spasms.   estradiol (ESTRACE) 0.1 MG/GM vaginal cream Place 1 Applicatorful vaginally 2 (two) times a week.   fluticasone  (FLONASE ) 50 MCG/ACT  nasal spray Place 1 spray into both nostrils at bedtime.   influenza vaccine adjuvanted (FLUAD  QUADRIVALENT) 0.5 ML injection Inject into the muscle.   ipratropium (ATROVENT ) 0.06 % nasal spray Place 1-2 sprays into both nostrils daily.   Magnesium 500 MG TABS Take 500 mg by mouth every other day.   mirabegron ER (MYRBETRIQ) 25 MG TB24 tablet Take 25 mg by mouth daily.   Multiple Vitamins-Minerals (ALIVE MULTI-VITAMIN PO) Take 1 tablet by mouth daily.   nitrofurantoin , macrocrystal-monohydrate, (MACROBID ) 100 MG capsule Take 1 capsule (100 mg total) by mouth 2 (two) times daily for 5 days.   Omega-3 Fatty Acids (FISH OIL) 1000 MG CAPS Take 1,400 mg by mouth See admin instructions. Take 1000 mg 4 times weekly   pantoprazole  (PROTONIX ) 40 MG tablet TAKE ONE TABLET BY MOUTH ONCE DAILY.   saccharomyces boulardii (FLORASTOR) 250 MG capsule Take 250 mg by mouth 3 (three) times a week.   triamcinolone cream (KENALOG) 0.1 % Apply 1 Application topically 2 (two) times daily.   TURMERIC PO Take 1,000 mg by mouth daily.   vitamin B-12 (CYANOCOBALAMIN) 1000 MCG tablet Take 1,000 mcg by mouth daily.   No facility-administered encounter medications on file as of 05/28/2024.    Review of Systems:  Review of Systems  Constitutional:  Negative for activity change, chills and fever.  Gastrointestinal:  Negative for abdominal distention, abdominal pain, diarrhea, nausea and vomiting.  Genitourinary:  Positive for dysuria, frequency and urgency. Negative for difficulty urinating and flank pain.    Health Maintenance  Topic Date Due   Zoster Vaccines- Shingrix (1 of 2) 12/16/1954   Medicare Annual Wellness (AWV)  06/05/2024   COVID-19 Vaccine (7 - Moderna risk 2024-25 season) 08/14/2024 (Originally 01/25/2024)   DTaP/Tdap/Td (3 - Td or Tdap) 04/28/2025 (Originally 11/20/2011)   INFLUENZA VACCINE  06/19/2024   Pneumococcal Vaccine: 50+ Years (3 of 3 - PCV20 or PCV21) 05/05/2029   DEXA SCAN  Completed    Hepatitis B Vaccines  Aged Out   HPV VACCINES  Aged Out   Meningococcal B Vaccine  Aged Out    Physical Exam: Vitals:   05/28/24 1404  BP: 138/82  Pulse: 86  Resp: 10  Temp: (!) 96.8 F (36 C)  SpO2: 94%  Weight: 135 lb 12.8 oz (61.6 kg)  Height: 5' 3 (1.6 m)   Body mass index is 24.06 kg/m. Physical Exam Constitutional:      Appearance: Normal appearance.  HENT:     Head: Normocephalic and atraumatic.     Nose: Nose normal.     Mouth/Throat:     Mouth: Mucous membranes are moist.  Eyes:     Conjunctiva/sclera: Conjunctivae normal.  Cardiovascular:     Rate and Rhythm: Normal rate and regular rhythm.  Pulmonary:     Effort: Pulmonary effort is normal.     Breath sounds: Normal breath sounds.  Abdominal:     General: Bowel sounds are normal.     Palpations: Abdomen is soft.  Musculoskeletal:        General: Normal range  of motion.     Cervical back: Normal range of motion.  Skin:    General: Skin is warm and dry.  Neurological:     General: No focal deficit present.     Mental Status: She is alert and oriented to person, place, and time.  Psychiatric:        Mood and Affect: Mood normal.        Behavior: Behavior normal.        Thought Content: Thought content normal.        Judgment: Judgment normal.     Labs reviewed: Basic Metabolic Panel: Recent Labs    03/26/24 0000  NA 140  140  K 4.4  CL 103  CO2 25*  BUN 13  13  CREATININE 0.7  0.7  CALCIUM 11.3*  11.3*  TSH 2.32   Liver Function Tests: Recent Labs    03/26/24 0000  AST 27  ALT 24  ALKPHOS 62  ALBUMIN 4.3   No results for input(s): LIPASE, AMYLASE in the last 8760 hours. No results for input(s): AMMONIA in the last 8760 hours. CBC: Recent Labs    03/26/24 0000  WBC 4.1  HGB 14.2  HCT 43  PLT 298   Lipid Panel: Recent Labs    03/26/24 0000  CHOL 234*  HDL 127*  LDLCALC 95  TRIG 62   No results found for: HGBA1C  Procedures since last visit: CT  ABDOMEN PELVIS W CONTRAST Result Date: 05/13/2024 CLINICAL DATA:  Left lower quadrant abdominal pain. EXAM: CT ABDOMEN AND PELVIS WITH CONTRAST TECHNIQUE: Multidetector CT imaging of the abdomen and pelvis was performed using the standard protocol following bolus administration of intravenous contrast. RADIATION DOSE REDUCTION: This exam was performed according to the departmental dose-optimization program which includes automated exposure control, adjustment of the mA and/or kV according to patient size and/or use of iterative reconstruction technique. CONTRAST:  75mL OMNIPAQUE  IOHEXOL  300 MG/ML  SOLN COMPARISON:  CT abdomen pelvis dated 09/19/2020. FINDINGS: Lower chest: The visualized lung bases are clear. No intra-abdominal free air or free fluid. Hepatobiliary: The liver is unremarkable. No biliary dilatation. The gallbladder is unremarkable. Pancreas: Unremarkable. No pancreatic ductal dilatation or surrounding inflammatory changes. Spleen: Normal in size without focal abnormality. Adrenals/Urinary Tract: The adrenal glands unremarkable. The kidneys, visualized ureters, and urinary bladder appear unremarkable. Stomach/Bowel: Postsurgical changes of sigmoid colon. There is no bowel obstruction or active inflammation. The appendix is normal. Vascular/Lymphatic: Mild aortoiliac atherosclerotic disease. The IVC is unremarkable. No portal venous gas. There is no adenopathy. Reproductive: Hysterectomy.  No suspicious adnexal masses. Other: None Musculoskeletal: Osteopenia with degenerative changes of the spine. No acute osseous pathology. IMPRESSION: 1. No acute intra-abdominal or pelvic pathology. 2. Postsurgical changes of sigmoid colon. No bowel obstruction. Normal appendix. 3.  Aortic Atherosclerosis (ICD10-I70.0). Electronically Signed   By: Vanetta Chou M.D.   On: 05/13/2024 17:36    Assessment/Plan  1. Urinary tract infection without hematuria, site unspecified (Primary) Dysuria Urinary  frequency -  Confirmed UTI with urine dipstick showing large blood, positive nitrite, and large leukocytes. Initiated nitrofurantoin  pending culture results. - Prescribe nitrofurantoin  for 5 days. - Send urine sample for culture. - nitrofurantoin , macrocrystal-monohydrate, (MACROBID ) 100 MG capsule; Take 1 capsule (100 mg total) by mouth 2 (two) times daily for 5 days.  Dispense: 10 capsule; Refill: 0 - POC Urinalysis Dipstick - Culture, Urine  2. OAB (overactive bladder) -  Chronic silent GERD with symptoms despite Protonix  40 mg daily. ENT reassessment scheduled. -  Continue Protonix  40 mg daily. - Follow up with ENT appointment in July.  3. Gastroesophageal reflux disease without esophagitis -  Chronic silent GERD with symptoms despite Protonix  40 mg daily. ENT reassessment scheduled. - Continue Protonix  40 mg daily.       Labs/tests ordered:   Urine dipstick and urine culture   No follow-ups on file.  Lailani Tool Medina-Vargas, NP

## 2024-05-29 LAB — URINE CULTURE
MICRO NUMBER:: 16684487
SPECIMEN QUALITY:: ADEQUATE

## 2024-05-29 NOTE — Telephone Encounter (Signed)
 Noted

## 2024-06-01 ENCOUNTER — Ambulatory Visit: Payer: Self-pay | Admitting: Adult Health

## 2024-06-01 NOTE — Progress Notes (Signed)
Urine culture is negative for UTI.

## 2024-06-04 ENCOUNTER — Encounter: Payer: Self-pay | Admitting: Internal Medicine

## 2024-06-22 ENCOUNTER — Ambulatory Visit (INDEPENDENT_AMBULATORY_CARE_PROVIDER_SITE_OTHER): Admitting: Otolaryngology

## 2024-06-22 ENCOUNTER — Encounter (INDEPENDENT_AMBULATORY_CARE_PROVIDER_SITE_OTHER): Payer: Self-pay | Admitting: Otolaryngology

## 2024-06-22 VITALS — BP 164/82 | HR 77

## 2024-06-22 DIAGNOSIS — R0981 Nasal congestion: Secondary | ICD-10-CM

## 2024-06-22 DIAGNOSIS — J342 Deviated nasal septum: Secondary | ICD-10-CM

## 2024-06-22 DIAGNOSIS — J343 Hypertrophy of nasal turbinates: Secondary | ICD-10-CM | POA: Diagnosis not present

## 2024-06-22 DIAGNOSIS — J310 Chronic rhinitis: Secondary | ICD-10-CM | POA: Diagnosis not present

## 2024-06-22 DIAGNOSIS — R0982 Postnasal drip: Secondary | ICD-10-CM

## 2024-06-23 DIAGNOSIS — R0982 Postnasal drip: Secondary | ICD-10-CM | POA: Insufficient documentation

## 2024-06-23 DIAGNOSIS — J343 Hypertrophy of nasal turbinates: Secondary | ICD-10-CM | POA: Insufficient documentation

## 2024-06-23 DIAGNOSIS — J342 Deviated nasal septum: Secondary | ICD-10-CM | POA: Insufficient documentation

## 2024-06-23 NOTE — Progress Notes (Signed)
 CC: Chronic nasal drainage  HPI:  Jessica Mcdonald is a 88 y.o. female who presents today complaining of chronic nasal drainage for many years.  She complains of both postnasal drainage and rhinorrhea.  She was treated with Atrovent  and Flonase  nasal spray as needed.  She underwent adenotonsillectomy surgery as a child.  She has no other ENT surgery.  She denies any significant allergy symptoms.  Currently she denies any facial pain, fever, or visual change.  She has a history of gastroesophageal reflux.  She is currently on pantoprazole  daily.  Past Medical History:  Diagnosis Date   Anorgasmia of female    Cancer [facility]) 1998   Left breast Lumpectomy, Chemo,Rad   Cataract 2010 2011   DDD (degenerative disc disease)    GERD (gastroesophageal reflux disease) 2000   Osteoporosis     Past Surgical History:  Procedure Laterality Date   ABDOMINAL HYSTERECTOMY  2019   with sling   BREAST SURGERY  3/98   lumpectomy (left) with nodes   BUNIONECTOMY Bilateral 2010   CATARACT EXTRACTION Bilateral 2010 2011   FLEXIBLE SIGMOIDOSCOPY N/A 12/29/2020   Procedure: FLEXIBLE SIGMOIDOSCOPY with propofol ;  Surgeon: Elicia Claw, MD;  Location: [facility];  Service: Gastroenterology;  Laterality: N/A;   FLEXIBLE SIGMOIDOSCOPY N/A 03/22/2021   Procedure: FLEXIBLE SIGMOIDOSCOPY;  Surgeon: Teresa Lonni HERO, MD;  Location: [facility];  Service: General;  Laterality: N/A;   MOHS SURGERY  08/2009   nose, squamous cell ca   SYNOVIAL CYST EXCISION     lumbar   XI ROBOTIC ASSISTED LOWER ANTERIOR RESECTION N/A 03/22/2021   Procedure: XI ROBOTIC ASSISTED LOWER ANTERIOR RESECTION TAKEDOWN OF COLOVAGINAL  FISTULA  AND REPAIR AND BILATERAL TAP BLOCKS;  Surgeon: Teresa Lonni HERO, MD;  Location: [facility];  Service: General;  Laterality: N/A;    Family History  Problem Relation Age of Onset   Osteoporosis Mother    Heart disease Father     Social History:  reports that she quit smoking about 60 years ago. Her  smoking use included cigarettes. She has never used smokeless tobacco. She reports current alcohol use of about 10.0 standard drinks of alcohol per week. She reports that she does not use drugs.  Allergies: No Known Allergies  Prior to Admission medications   Medication Sig Start Date End Date Taking? Authorizing Provider  Azelaic Acid 15 % cream Apply 1 application topically 2 (two) times daily. 10/31/20  Yes [provider]  Cholecalciferol (VITAMIN D3) 25 MCG (1000 UT) CAPS Take 1 capsule (1,000 Units total) by mouth daily. 04/28/24  Yes Charlanne Fredia CROME, MD  COVID-19 mRNA bivalent vaccine, Moderna, (MODERNA COVID-19 BIVAL BOOSTER) 50 MCG/0.5ML injection Inject into the muscle. 08/17/21  Yes Luiz Channel, MD  dicyclomine  (BENTYL ) 10 MG capsule Take 1 capsule (10 mg total) by mouth daily as needed for spasms. 02/18/24  Yes Charlanne Fredia CROME, MD  estradiol (ESTRACE) 0.1 MG/GM vaginal cream Place 1 Applicatorful vaginally 2 (two) times a week. 10/31/20  Yes [provider]  fluticasone  (FLONASE ) 50 MCG/ACT nasal spray Place 1 spray into both nostrils at bedtime.   Yes [provider]  influenza vaccine adjuvanted (FLUAD  QUADRIVALENT) 0.5 ML injection Inject into the muscle. 08/20/22  Yes Luiz Channel, MD  ipratropium (ATROVENT ) 0.06 % nasal spray Place 1-2 sprays into both nostrils daily. 10/31/20  Yes [provider]  Magnesium 500 MG TABS Take 500 mg by mouth every other day.   Yes [provider]  mirabegron ER (  MYRBETRIQ) 25 MG TB24 tablet Take 25 mg by mouth daily.   Yes [provider]  Multiple Vitamins-Minerals (ALIVE MULTI-VITAMIN PO) Take 1 tablet by mouth daily.   Yes [provider]  Omega-3 Fatty Acids (FISH OIL) 1000 MG CAPS Take 1,400 mg by mouth See admin instructions. Take 1000 mg 4 times weekly   Yes [provider]  pantoprazole  (PROTONIX ) 40 MG tablet TAKE ONE TABLET BY MOUTH ONCE DAILY. 03/10/24  Yes Charlanne Fredia CROME, MD  saccharomyces boulardii (FLORASTOR) 250 MG capsule Take 250 mg by mouth 3 (three) times a week.   Yes [provider]  triamcinolone cream (KENALOG) 0.1 % Apply 1 Application topically 2 (two) times daily.   Yes [provider]  TURMERIC PO Take 1,000 mg by mouth daily.   Yes [provider]  vitamin B-12 (CYANOCOBALAMIN) 1000 MCG tablet Take 1,000 mcg by mouth daily.   Yes [provider]    Blood pressure (!) 164/82, pulse 77, last menstrual period 11/20/1987, SpO2 98%. Exam: General: Communicates without difficulty, well nourished, no acute distress. Head: Normocephalic, no evidence injury, no tenderness, facial buttresses intact without stepoff. Face/sinus: No tenderness to palpation and percussion. Facial movement is normal and symmetric. Eyes: PERRL, EOMI. No scleral icterus, conjunctivae clear. Neuro: CN II exam reveals vision grossly intact.  No nystagmus at any point of gaze. Ears: Auricles well formed without lesions.  Ear canals are intact without mass or lesion.  No erythema or edema is appreciated.  The TMs are intact without fluid. Nose: External evaluation reveals normal support and skin without lesions.  Dorsum is intact.  Anterior rhinoscopy reveals congested mucosa over anterior aspect of inferior turbinates and intact septum.  No purulence noted. Oral:  Oral cavity and oropharynx are intact, symmetric, without erythema or edema.  Mucosa is moist without lesions. Neck: Full range of motion without pain.  There is no significant lymphadenopathy.  No masses palpable.  Thyroid  bed within normal limits to palpation.  Parotid glands and submandibular glands equal bilaterally without mass.  Trachea is midline. Neuro:  CN 2-12 grossly intact.   Procedure:  Flexible Nasal Endoscopy: Description: Risks, benefits, and alternatives of flexible endoscopy were explained to the patient.  Specific mention was made of the risk of throat numbness with  difficulty swallowing, possible bleeding from the nose and mouth, and pain from the procedure.  The patient gave oral consent to proceed.  The flexible scope was inserted into the right nasal cavity.  Endoscopy of the interior nasal cavity, superior, inferior, and middle meatus was performed. The sphenoid-ethmoid recess was examined. Edematous mucosa was noted.  No polyp, mass, or lesion was appreciated. Nasal septal deviation with septal spur. Olfactory cleft was clear.  Nasopharynx was clear.  Turbinates were hypertrophied but without mass.  The procedure was repeated on the contralateral side with similar findings.  The patient tolerated the procedure well.   Assessment: 1.  Chronic rhinitis with nasal mucosal congestion, nasal septal deviation, and bilateral inferior turbinate hypertrophy.  A large left septal spur is noted. 2.  Chronic nasal drainage. 3.  No polyps, mass, lesion, or acute infection is noted today.  Plan: 1.  The physical exam and nasal endoscopy findings are reviewed with the patient. 2.  Continue with Atrovent  nasal spray twice daily as needed to treat the nasal drainage. 3.  The option of cryotherapy to treat her chronic nasal drainage is also discussed.  The patient is not interested in undergoing any invasive  procedure at this time. 5.  The patient is encouraged to call with any questions or concerns.  Arbor Cohen W Zion Lint 06/23/2024, 10:07 AM

## 2024-07-28 LAB — BASIC METABOLIC PANEL WITH GFR
BUN: 12 (ref 4–21)
CO2: 23 — AB (ref 13–22)
Chloride: 102 (ref 99–108)
Creatinine: 0.8 (ref 0.5–1.1)
Glucose: 107
Potassium: 4.6 meq/L (ref 3.5–5.1)
Sodium: 137 (ref 137–147)

## 2024-07-28 LAB — COMPREHENSIVE METABOLIC PANEL WITH GFR
Albumin: 4 (ref 3.5–5.0)
Calcium: 11.3 — AB (ref 8.7–10.7)
Globulin: 2.3
eGFR: 77

## 2024-07-28 LAB — HEPATIC FUNCTION PANEL
ALT: 17 U/L (ref 7–35)
AST: 20 (ref 13–35)
Alkaline Phosphatase: 70 (ref 25–125)
Bilirubin, Total: 0.5

## 2024-08-04 ENCOUNTER — Non-Acute Institutional Stay: Admitting: Internal Medicine

## 2024-08-04 ENCOUNTER — Encounter: Payer: Self-pay | Admitting: Internal Medicine

## 2024-08-04 VITALS — BP 140/78 | HR 82 | Temp 98.1°F | Ht 63.0 in | Wt 135.4 lb

## 2024-08-04 DIAGNOSIS — N3281 Overactive bladder: Secondary | ICD-10-CM

## 2024-08-04 DIAGNOSIS — M810 Age-related osteoporosis without current pathological fracture: Secondary | ICD-10-CM

## 2024-08-04 DIAGNOSIS — K219 Gastro-esophageal reflux disease without esophagitis: Secondary | ICD-10-CM | POA: Diagnosis not present

## 2024-08-04 DIAGNOSIS — J310 Chronic rhinitis: Secondary | ICD-10-CM

## 2024-08-04 DIAGNOSIS — S81812D Laceration without foreign body, left lower leg, subsequent encounter: Secondary | ICD-10-CM

## 2024-08-04 MED ORDER — PANTOPRAZOLE SODIUM 40 MG PO TBEC: 40.0000 mg | DELAYED_RELEASE_TABLET | Freq: Every day | ORAL | Status: AC

## 2024-08-04 MED ORDER — COVID-19 MRNA VACC (MODERNA) 50 MCG/0.5ML IM SUSY: 0.5000 mL | PREFILLED_SYRINGE | INTRAMUSCULAR | 1 refills | Status: AC

## 2024-08-04 NOTE — Progress Notes (Signed)
 Location:  [facility] features editor of Service:  Clinic (12)  Provider:   Code Status:  Goals of Care:     02/18/2024   10:36 AM  Advanced Directives  Does Patient Have a Medical Advance Directive? Yes  Type of Estate agent of [name];Living Jessica Mcdonald;Out of facility DNR (pink MOST or yellow form)  Does patient want to make changes to medical advance directive? No - Patient declined  Copy of Healthcare Power of Attorney in Chart? Yes - validated most recent copy scanned in chart (See row information)  Pre-existing out of facility DNR order (yellow form or pink MOST form) Yellow form placed in chart (order not valid for inpatient use)     Chief Complaint  Patient presents with   Follow-up    3 month follow up    HPI: Patient is a 88 y.o. female seen today for medical management of chronic diseases.    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 Teresa  OAB, UUI: on mirabegron Follows with Dr Jessica Mcdonald    Discussed the use of AI scribe software for clinical note transcription with the patient, who gave verbal consent to proceed.  History of Present Illness   Jessica Mcdonald is an 88 year old female who presents with a leg injury following a fall.  Eleven days ago, she sustained a leg abrasion from a fall when her husband accidentally fell on her. The wound has been receiving care but bled slightly during a recent bandage change. She bruises easily, which she attributes to age and thin skin. She is not on blood thinners. For pain, she uses Arnica,  and occasionally Tylenol . Her back is sore but improving with heat application.  She inquires about her medication, Protonix , for heartburn and adjusts the timing of her doses based on symptoms. She has not experienced reflux recently.      Was treated for UTI in 7/25 But her Culture was negative She says her symptoms got  better with Macrodantin   Abdominal Pain Intermittent CT scan negative Pain Relieved by Bentyl  PRn  For Nasal Congestion Saw Dr Jessica Mcdonald and he said to continue Atrovent  Nasal Spray Past Medical History:  Diagnosis Date   Anorgasmia of female    Cancer [facility]) 1998   Left breast Lumpectomy, Chemo,Rad   Cataract 2010 2011   DDD (degenerative disc disease)    GERD (gastroesophageal reflux disease) 2000   Osteoporosis     Past Surgical History:  Procedure Laterality Date   ABDOMINAL HYSTERECTOMY  2019   with sling   BREAST SURGERY  3/98   lumpectomy (left) with nodes   BUNIONECTOMY Bilateral 2010   CATARACT EXTRACTION Bilateral 2010 2011   FLEXIBLE SIGMOIDOSCOPY N/A 12/29/2020   Procedure: FLEXIBLE SIGMOIDOSCOPY with propofol ;  Surgeon: Elicia Claw, MD;  [facility];  Service: Gastroenterology;  Laterality: N/A;   FLEXIBLE SIGMOIDOSCOPY N/A 03/22/2021   Procedure: FLEXIBLE SIGMOIDOSCOPY;  Surgeon: Jessica Lonni HERO, MD;  Location: [facility];  Service: General;  Laterality: N/A;   MOHS SURGERY  08/2009   nose, squamous cell ca   SYNOVIAL CYST EXCISION     lumbar   XI ROBOTIC ASSISTED LOWER ANTERIOR RESECTION N/A 03/22/2021   Procedure: XI ROBOTIC ASSISTED LOWER ANTERIOR RESECTION TAKEDOWN OF COLOVAGINAL  FISTULA  AND REPAIR AND BILATERAL TAP BLOCKS;  Surgeon: Jessica Lonni HERO, MD;  Location: [facility];  Service: General;  Laterality: N/A;    No Known Allergies  Outpatient Encounter Medications as of 08/04/2024  Medication Sig   Azelaic Acid 15 % cream Apply 1 application topically 2 (two) times daily.   Cholecalciferol (VITAMIN D3) 25 MCG (1000 UT) CAPS Take 1 capsule (1,000 Units total) by mouth daily.   COVID-19 mRNA bivalent vaccine, Moderna, (MODERNA COVID-19 BIVAL BOOSTER) 50 MCG/0.5ML injection Inject into the muscle.   dicyclomine  (BENTYL ) 10 MG capsule Take 1 capsule (10 mg total) by mouth daily as needed for spasms.   estradiol (ESTRACE) 0.1 MG/GM vaginal  cream Place 1 Applicatorful vaginally 2 (two) times a week.   fluticasone  (FLONASE ) 50 MCG/ACT nasal spray Place 1 spray into both nostrils at bedtime.   influenza vaccine adjuvanted (FLUAD  QUADRIVALENT) 0.5 ML injection Inject into the muscle.   ipratropium (ATROVENT ) 0.06 % nasal spray Place 1-2 sprays into both nostrils daily.   Magnesium 500 MG TABS Take 500 mg by mouth every other day.   mirabegron ER (MYRBETRIQ) 25 MG TB24 tablet Take 25 mg by mouth daily.   Multiple Vitamins-Minerals (ALIVE MULTI-VITAMIN PO) Take 1 tablet by mouth daily.   Omega-3 Fatty Acids (FISH OIL) 1000 MG CAPS Take 1,400 mg by mouth See admin instructions. Take 1000 mg 4 times weekly   pantoprazole  (PROTONIX ) 40 MG tablet TAKE ONE TABLET BY MOUTH ONCE DAILY.   saccharomyces boulardii (FLORASTOR) 250 MG capsule Take 250 mg by mouth 3 (three) times a week.   triamcinolone cream (KENALOG) 0.1 % Apply 1 Application topically 2 (two) times daily.   TURMERIC PO Take 1,000 mg by mouth daily.   vitamin B-12 (CYANOCOBALAMIN) 1000 MCG tablet Take 1,000 mcg by mouth daily.   No facility-administered encounter medications on file as of 08/04/2024.    Review of Systems:  Review of Systems  Constitutional:  Negative for activity change and appetite change.  HENT: Negative.    Respiratory:  Negative for cough and shortness of breath.   Cardiovascular:  Negative for leg swelling.  Gastrointestinal:  Negative for constipation.  Genitourinary:  Positive for frequency and urgency.  Musculoskeletal:  Negative for arthralgias, gait problem and myalgias.  Skin:  Positive for wound.  Neurological:  Negative for dizziness and weakness.  Psychiatric/Behavioral:  Negative for confusion, dysphoric mood and sleep disturbance.     Health Maintenance  Topic Date Due   Zoster Vaccines- Shingrix (1 of 2) 12/16/1954   Medicare Annual Wellness (AWV)  06/05/2024   COVID-19 Vaccine (7 - Moderna risk 2024-25 season) 07/20/2024   Influenza  Vaccine  09/24/2024 (Originally 06/19/2024)   DTaP/Tdap/Td (3 - Td or Tdap) 04/28/2025 (Originally 11/20/2011)   Pneumococcal Vaccine: 50+ Years  Completed   DEXA SCAN  Completed   HPV VACCINES  Aged Out   Meningococcal B Vaccine  Aged Out    Physical Exam: Vitals:   08/04/24 0815  BP: (!) 140/78  Pulse: 82  Temp: 98.1 F (36.7 C)  SpO2: 98%  Weight: 135 lb 6.4 oz (61.4 kg)  Height: 5' 3 (1.6 m)   Body mass index is 23.99 kg/m. Physical Exam Vitals reviewed.  Constitutional:      Appearance: Normal appearance.  HENT:     Head: Normocephalic.     Nose: Congestion present.     Mouth/Throat:     Mouth: Mucous membranes are moist.     Pharynx: Oropharynx is clear.  Eyes:     Pupils: Pupils are equal, round, and reactive to light.  Cardiovascular:  Rate and Rhythm: Normal rate and regular rhythm.     Pulses: Normal pulses.     Heart sounds: Normal heart sounds. No murmur heard. Pulmonary:     Effort: Pulmonary effort is normal.     Breath sounds: Normal breath sounds.  Abdominal:     General: Abdomen is flat. Bowel sounds are normal.     Palpations: Abdomen is soft.  Musculoskeletal:        General: No swelling.     Cervical back: Neck supple.  Skin:    General: Skin is warm.     Comments: Had Skin tear in Left Arm Almost healed  Left Leg Had new Polymem dressing  Did not remove Wait for Nurse to send me the Pic  Neurological:     General: No focal deficit present.     Mental Status: She is alert and oriented to person, place, and time.  Psychiatric:        Mood and Affect: Mood normal.        Thought Content: Thought content normal.     Labs reviewed: Basic Metabolic Panel: Recent Labs    03/26/24 0000 07/28/24 0000  NA 140  140 137  K 4.4 4.6  CL 103 102  CO2 25* 23*  BUN 13  13 12   CREATININE 0.7  0.7 0.8  CALCIUM 11.3*  11.3* 11.3*  TSH 2.32  --    Liver Function Tests: Recent Labs    03/26/24 0000 07/28/24 0000  AST 27 20  ALT 24  17  ALKPHOS 62 70  ALBUMIN 4.3 4.0   No results for input(s): LIPASE, AMYLASE in the last 8760 hours. No results for input(s): AMMONIA in the last 8760 hours. CBC: Recent Labs    03/26/24 0000  WBC 4.1  HGB 14.2  HCT 43  PLT 298   Lipid Panel: Recent Labs    03/26/24 0000  CHOL 234*  HDL 127*  LDLCALC 95  TRIG 62   No results found for: HGBA1C  Procedures since last visit: No results found.  Assessment/Plan Assessment and Plan    Healing right lower extremity abrasion after fall Abrasion healing well with minor bleeding, no infection signs. - Apply a bandaid to the abrasion. - Request a picture of the abrasion from [facility]. - Consider antibiotics if signs of infection develop.  Hypercalcemia Persistent hypercalcemia with calcium level of 11.3. Investigating parathyroid involvement. - Discontinue all calcium and vitamin D  supplements. - Recheck calcium levels in 4-6 weeks. - Check parathyroid hormone levels at next blood test.  Lumbar spinal stenosis Chronic lumbar spinal stenosis with back pain managed with heat and Tylenol .  Urinary incontinence Chronic urinary incontinence managed with Myrbetriq and vaginal estradiol cream. - Continue Myrbetriq. - Use vaginal estradiol cream regularly. - Consider cranberry tablets for urinary health.  Recurrent urinary tract infection Recent UTI symptoms resolved, culture negative. Advised on prevention. - Use cranberry tablets.and Vaginal Estradiol - Maintain adequate hydration.  Nasal septal deviation with turbinate hypertrophy Managed with Atrovent  nasal spray and NeilMed sinus rinse. - Continue Atrovent  nasal spray. - Continue NeilMed sinus rinse.  Gastroesophageal reflux disease GERD managed with pantoprazole , occasional heartburn present. - Take pantoprazole  twice daily for one week, then as needed. - Use [employer] for occasional reflux symptoms.      Chronic LLQ pain Gets relief with  Bentyl  Does have h/o Diverticulosis  S/p Colovaginal Fistula  CT negative  Osteoporosis in Left Radius -3.4 RFN -1.8 LFN -1.90  Repeat DEXA  Ordered Labs/tests ordered:   Next appt:  Visit date not found

## 2024-08-25 ENCOUNTER — Other Ambulatory Visit (HOSPITAL_BASED_OUTPATIENT_CLINIC_OR_DEPARTMENT_OTHER): Payer: Self-pay

## 2024-08-25 MED ORDER — FLUZONE HIGH-DOSE 0.5 ML IM SUSY
0.5000 mL | PREFILLED_SYRINGE | Freq: Once | INTRAMUSCULAR | 0 refills | Status: AC
  Filled 2024-08-25: qty 0.5, 1d supply, fill #0

## 2024-09-10 ENCOUNTER — Encounter: Payer: Self-pay | Admitting: Internal Medicine

## 2024-09-11 NOTE — Telephone Encounter (Signed)
 Patient has made an appointment with Charlanne Fredia CROME, MD to be seen at [facility] clinic

## 2024-09-15 ENCOUNTER — Non-Acute Institutional Stay: Admitting: Internal Medicine

## 2024-09-15 ENCOUNTER — Encounter: Payer: Self-pay | Admitting: Internal Medicine

## 2024-09-15 VITALS — BP 138/68 | HR 78 | Temp 97.2°F | Ht 63.0 in | Wt 135.8 lb

## 2024-09-15 DIAGNOSIS — G8929 Other chronic pain: Secondary | ICD-10-CM | POA: Diagnosis not present

## 2024-09-15 DIAGNOSIS — S81802D Unspecified open wound, left lower leg, subsequent encounter: Secondary | ICD-10-CM

## 2024-09-15 DIAGNOSIS — M5441 Lumbago with sciatica, right side: Secondary | ICD-10-CM | POA: Diagnosis not present

## 2024-09-15 LAB — BASIC METABOLIC PANEL WITH GFR
CO2: 29 — AB (ref 13–22)
Chloride: 103 (ref 99–108)
Creatinine: 0.6 (ref 0.5–1.1)
Glucose: 98
Potassium: 4.4 meq/L (ref 3.5–5.1)
Sodium: 140 (ref 137–147)

## 2024-09-15 LAB — LAB REPORT - SCANNED
EGFR: 86
PTH: 125

## 2024-09-15 LAB — COMPREHENSIVE METABOLIC PANEL WITH GFR
Calcium: 10.6 (ref 8.7–10.7)
Globulin: 2.5

## 2024-09-15 LAB — HEPATIC FUNCTION PANEL
ALT: 18 U/L (ref 7–35)
AST: 21 (ref 13–35)

## 2024-09-15 MED ORDER — DOXYCYCLINE HYCLATE 100 MG PO TABS: 100.0000 mg | ORAL_TABLET | Freq: Two times a day (BID) | ORAL | 0 refills | Status: DC

## 2024-09-15 NOTE — Patient Instructions (Addendum)
 Call Dr Ihor office by next week if you dont hear from them Call [facility] if you dont hear from them by next week

## 2024-09-15 NOTE — Progress Notes (Unsigned)
 Location: [facility] Editor of Service:  Clinic (12)  Provider:   Code Status:  Goals of Care:     02/18/2024   10:36 AM  Advanced Directives  Does Patient Have a Medical Advance Directive? Yes  Type of Estate Agent of [place];Living will;Out of facility DNR (pink MOST or yellow form)  Does patient want to make changes to medical advance directive? No - Patient declined  Copy of Healthcare Power of Attorney in Chart? Yes - validated most recent copy scanned in chart (See row information)  Pre-existing out of facility DNR order (yellow form or pink MOST form) Yellow form placed in chart (order not valid for inpatient use)     Chief Complaint  Patient presents with   Medical Management of Chronic Issues    Patient would like discuss back pain and calcium and vitamin D      HPI: Patient is a 88 y.o. female seen today for an acute visit for number of issues      Discussed the use of AI scribe software for clinical note transcription with the patient, who gave verbal consent to proceed.  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  Back Pain She experienced a recent fall when her husband fell on her, leading to back pain originating in the lower back at the L4-5 region, sometimes radiating to the hip. Pain is exacerbated by sitting on soft surfaces and relieved by sitting on a wedge pillow. She has a history of severe L4-5 stenosis and has previously received effective injections for pain management.  Recently, she has experienced a recurrence of symptoms, including sciatica and burning in the foot, with some improvement noted in recent days. There is no constant back pain, and it is relieved by certain positions.  Right LE wound for past 6 weeks She has a wound with redness attributed to a fall involving a heavy pot. The area is red and  wet, with a small open spot that has been debrided. [facility] in [place] is managing  the wound with dressings, and the redness is recent. There is no heat or soreness in the area.           Past Medical History:  Diagnosis Date   Anorgasmia of female    Cancer [facility]) 1998   Left breast Lumpectomy, Chemo,Rad   Cataract 2010 2011   DDD (degenerative disc disease)    GERD (gastroesophageal reflux disease) 2000   Osteoporosis     Past Surgical History:  Procedure Laterality Date   ABDOMINAL HYSTERECTOMY  2019   with sling   BREAST SURGERY  3/98   lumpectomy (left) with nodes   BUNIONECTOMY Bilateral 2010   CATARACT EXTRACTION Bilateral 2010 2011   FLEXIBLE SIGMOIDOSCOPY N/A 12/29/2020   Procedure: FLEXIBLE SIGMOIDOSCOPY with propofol ;  Surgeon: Elicia Claw, MD;  Location: [facility];  Service: Gastroenterology;  Laterality: N/A;   FLEXIBLE SIGMOIDOSCOPY N/A 03/22/2021   Procedure: FLEXIBLE SIGMOIDOSCOPY;  Surgeon: Teresa Lonni HERO, MD;  Location: [facility];  Service: General;  Laterality: N/A;   MOHS SURGERY  08/2009   nose, squamous cell ca   SYNOVIAL CYST EXCISION     lumbar   XI ROBOTIC ASSISTED LOWER ANTERIOR RESECTION N/A 03/22/2021   Procedure: XI ROBOTIC ASSISTED LOWER ANTERIOR RESECTION TAKEDOWN OF COLOVAGINAL  FISTULA  AND REPAIR AND BILATERAL TAP BLOCKS;  Surgeon: Teresa Lonni HERO, MD;  Location: [facility];  Service: General;  Laterality: N/A;    No Known Allergies  Outpatient Encounter Medications as of 09/15/2024  Medication Sig   Azelaic Acid 15 % cream Apply 1 application topically 2 (two) times daily.   COVID-19 mRNA vaccine (SPIKEVAX) syringe Inject 0.5 mLs into the muscle as directed.   dicyclomine  (BENTYL ) 10 MG capsule Take 1 capsule (10 mg total) by mouth daily as needed for spasms.   doxycycline (VIBRA-TABS) 100 MG tablet Take 1 tablet (100 mg total) by mouth 2 (two) times daily.   estradiol (ESTRACE) 0.1 MG/GM vaginal cream Place 1 Applicatorful vaginally 2  (two) times a week.   fluticasone  (FLONASE ) 50 MCG/ACT nasal spray Place 1 spray into both nostrils at bedtime.   influenza vaccine adjuvanted (FLUAD  QUADRIVALENT) 0.5 ML injection Inject into the muscle.   ipratropium (ATROVENT ) 0.06 % nasal spray Place 1-2 sprays into both nostrils daily.   Magnesium 500 MG TABS Take 500 mg by mouth every other day.   mirabegron ER (MYRBETRIQ) 25 MG TB24 tablet Take 25 mg by mouth daily.   Omega-3 Fatty Acids (FISH OIL) 1000 MG CAPS Take 1,400 mg by mouth See admin instructions. Take 1000 mg 4 times weekly   pantoprazole  (PROTONIX ) 40 MG tablet Take 1 tablet (40 mg total) by mouth daily. Can take extr pill in the Evening PRN   saccharomyces boulardii (FLORASTOR) 250 MG capsule Take 250 mg by mouth 3 (three) times a week.   triamcinolone cream (KENALOG) 0.1 % Apply 1 Application topically 2 (two) times daily.   TURMERIC PO Take 1,000 mg by mouth daily.   vitamin B-12 (CYANOCOBALAMIN) 1000 MCG tablet Take 1,000 mcg by mouth daily.   No facility-administered encounter medications on file as of 09/15/2024.    Review of Systems:  Review of Systems  Health Maintenance  Topic Date Due   Zoster Vaccines- Shingrix (1 of 2) 12/16/1954   Medicare Annual Wellness (AWV)  06/05/2024   Mammogram  10/23/2024   COVID-19 Vaccine (7 - 2025-26 season) 11/18/2024 (Originally 07/20/2024)   DTaP/Tdap/Td (3 - Td or Tdap) 04/28/2025 (Originally 11/20/2011)   Pneumococcal Vaccine: 50+ Years  Completed   Influenza Vaccine  Completed   DEXA SCAN  Completed   Meningococcal B Vaccine  Aged Out    Physical Exam: Vitals:   09/15/24 1133  BP: 138/68  Pulse: 78  Temp: (!) 97.2 F (36.2 C)  SpO2: 98%  Weight: 135 lb 12.8 oz (61.6 kg)  Height: 5' 3 (1.6 m)   Body mass index is 24.06 kg/m. Physical Exam  Labs reviewed: Basic Metabolic Panel: Recent Labs    03/26/24 0000 07/28/24 0000  NA 140  140 137  K 4.4 4.6  CL 103 102  CO2 25* 23*  BUN 13  13 12    CREATININE 0.7  0.7 0.8  CALCIUM 11.3*  11.3* 11.3*  TSH 2.32  --    Liver Function Tests: Recent Labs    03/26/24 0000 07/28/24 0000  AST 27 20  ALT 24 17  ALKPHOS 62 70  ALBUMIN 4.3 4.0   No results for input(s): LIPASE, AMYLASE in the last 8760 hours. No results for input(s): AMMONIA in the last 8760 hours. CBC: Recent Labs    03/26/24 0000  WBC 4.1  HGB 14.2  HCT 43  PLT 298   Lipid Panel: Recent Labs    03/26/24 0000  CHOL 234*  HDL 127*  LDLCALC 95  TRIG  62   No results found for: HGBA1C  Procedures since last visit: No results found.  Assessment/Plan 1. Chronic right-sided low back pain with right-sided sciatica (Primary) *** - Ambulatory referral to Neurosurgery  2. Wound of left lower extremity, subsequent encounter ***  3. Serum calcium elevated *** - Ambulatory referral to Endocrinology  Assessment and Plan    Cellulitis of skin wound Recent redness and wetness indicate bacterial infection. Wound size reduced. - Prescribe doxycycline for 10 days. - Instruct her to take a picture of the wound for documentation.  Lumbar spinal stenosis with back pain and radiculopathy Chronic back pain with severe L4-5 stenosis, worsened post-fall. Pain radiates to hip, improves with wedge pillow. Sciatica and burning foot present. - Refer to Dr. Colon for further evaluation and potential MRI. - Continue using Tylenol  for pain management.  Hypercalcemia Calcium levels high, latest at 11.3 mg/dL. Further investigation with PTH test planned. - Order PTH test to assess parathyroid function. - Refer to an endocrinologist for further evaluation.        Labs/tests ordered:  * No order type specified * Next appt:  Visit date not found

## 2024-09-16 ENCOUNTER — Encounter: Payer: Self-pay | Admitting: Internal Medicine

## 2024-09-21 ENCOUNTER — Other Ambulatory Visit: Payer: Self-pay | Admitting: Internal Medicine

## 2024-09-21 NOTE — Telephone Encounter (Signed)
 To pharmacy: PATIENT HAS MISPLACED MEDICATION AND NEEDS A REFILL TO COMPLETE THERAPY     Forwarded Rx to Dr. Charlanne for approval.

## 2024-09-28 ENCOUNTER — Encounter: Payer: Self-pay | Admitting: Adult Health

## 2024-09-28 ENCOUNTER — Non-Acute Institutional Stay: Payer: Self-pay | Admitting: Adult Health

## 2024-09-28 VITALS — BP 150/90 | HR 87 | Temp 97.2°F | Ht 63.0 in | Wt 133.8 lb

## 2024-09-28 DIAGNOSIS — Z0000 Encounter for general adult medical examination without abnormal findings: Secondary | ICD-10-CM | POA: Diagnosis not present

## 2024-09-28 NOTE — Patient Instructions (Signed)
 [name] , Thank you for taking time to come for your Medicare Wellness Visit. I appreciate your ongoing commitment to your health goals. Please review the following plan we discussed and let me know if I can assist you in the future.   Screening recommendations/referrals: Colonoscopy aged out Mammogram Due Dec 2025 Bone Density Ordered  Recommended yearly ophthalmology/optometry visit for glaucoma screening and checkup Recommended yearly dental visit for hygiene and checkup  Vaccinations: Influenza vaccine- due annually in September/October Pneumococcal vaccine up to date  Tdap vaccine Recommnded Shingles vaccine up to date Recommend Covid vaccine    Advanced directives: reviewed   Conditions/risks identified: NA  Next appointment: 1 year   Preventive Care 86 Years and Older, Female Preventive care refers to lifestyle choices and visits with your health care provider that can promote health and wellness. What does preventive care include? A yearly physical exam. This is also called an annual well check. Dental exams once or twice a year. Routine eye exams. Ask your health care provider how often you should have your eyes checked. Personal lifestyle choices, including: Daily care of your teeth and gums. Regular physical activity. Eating a healthy diet. Avoiding tobacco and drug use. Limiting alcohol use. Practicing safe sex. Taking low-dose aspirin every day. Taking vitamin and mineral supplements as recommended by your health care provider. What happens during an annual well check? The services and screenings done by your health care provider during your annual well check will depend on your age, overall health, lifestyle risk factors, and family history of disease. Counseling  Your health care provider may ask you questions about your: Alcohol use. Tobacco use. Drug use. Emotional well-being. Home and relationship well-being. Sexual activity. Eating habits. History  of falls. Memory and ability to understand (cognition). Work and work astronomer. Reproductive health. Screening  You may have the following tests or measurements: Height, weight, and BMI. Blood pressure. Lipid and cholesterol levels. These may be checked every 5 years, or more frequently if you are over 73 years old. Skin check. Lung cancer screening. You may have this screening every year starting at age 86 if you have a 30-pack-year history of smoking and currently smoke or have quit within the past 15 years. Fecal occult blood test (FOBT) of the stool. You may have this test every year starting at age 86. Flexible sigmoidoscopy or colonoscopy. You may have a sigmoidoscopy every 5 years or a colonoscopy every 10 years starting at age 86. Hepatitis C blood test. Hepatitis B blood test. Sexually transmitted disease (STD) testing. Diabetes screening. This is done by checking your blood sugar (glucose) after you have not eaten for a while (fasting). You may have this done every 1-3 years. Bone density scan. This is done to screen for osteoporosis. You may have this done starting at age 86. Mammogram. This may be done every 1-2 years. Talk to your health care provider about how often you should have regular mammograms. Talk with your health care provider about your test results, treatment options, and if necessary, the need for more tests. Vaccines  Your health care provider may recommend certain vaccines, such as: Influenza vaccine. This is recommended every year. Tetanus, diphtheria, and acellular pertussis (Tdap, Td) vaccine. You may need a Td booster every 10 years. Zoster vaccine. You may need this after age 86. Pneumococcal 13-valent conjugate (PCV13) vaccine. One dose is recommended after age 86. Pneumococcal polysaccharide (PPSV23) vaccine. One dose is recommended after age 86. Talk to your health care provider  about which screenings and vaccines you need and how often you need  them. This information is not intended to replace advice given to you by your health care provider. Make sure you discuss any questions you have with your health care provider. Document Released: 12/02/2015 Document Revised: 07/25/2016 Document Reviewed: 09/06/2015 Elsevier Interactive Patient Education  2017 [employer].  Fall Prevention in the Home Falls can cause injuries. They can happen to people of all ages. There are many things you can do to make your home safe and to help prevent falls. What can I do on the outside of my home? Regularly fix the edges of walkways and driveways and fix any cracks. Remove anything that might make you trip as you walk through a door, such as a raised step or threshold. Trim any bushes or trees on the path to your home. Use bright outdoor lighting. Clear any walking paths of anything that might make someone trip, such as rocks or tools. Regularly check to see if handrails are loose or broken. Make sure that both sides of any steps have handrails. Any raised decks and porches should have guardrails on the edges. Have any leaves, snow, or ice cleared regularly. Use sand or salt on walking paths during winter. Clean up any spills in your garage right away. This includes oil or grease spills. What can I do in the bathroom? Use night lights. Install grab bars by the toilet and in the tub and shower. Do not use towel bars as grab bars. Use non-skid mats or decals in the tub or shower. If you need to sit down in the shower, use a plastic, non-slip stool. Keep the floor dry. Clean up any water  that spills on the floor as soon as it happens. Remove soap buildup in the tub or shower regularly. Attach bath mats securely with double-sided non-slip rug tape. Do not have throw rugs and other things on the floor that can make you trip. What can I do in the bedroom? Use night lights. Make sure that you have a light by your bed that is easy to reach. Do not use  any sheets or blankets that are too big for your bed. They should not hang down onto the floor. Have a firm chair that has side arms. You can use this for support while you get dressed. Do not have throw rugs and other things on the floor that can make you trip. What can I do in the kitchen? Clean up any spills right away. Avoid walking on wet floors. Keep items that you use a lot in easy-to-reach places. If you need to reach something above you, use a strong step stool that has a grab bar. Keep electrical cords out of the way. Do not use floor polish or wax that makes floors slippery. If you must use wax, use non-skid floor wax. Do not have throw rugs and other things on the floor that can make you trip. What can I do with my stairs? Do not leave any items on the stairs. Make sure that there are handrails on both sides of the stairs and use them. Fix handrails that are broken or loose. Make sure that handrails are as long as the stairways. Check any carpeting to make sure that it is firmly attached to the stairs. Fix any carpet that is loose or worn. Avoid having throw rugs at the top or bottom of the stairs. If you do have throw rugs, attach them to the floor  with carpet tape. Make sure that you have a light switch at the top of the stairs and the bottom of the stairs. If you do not have them, ask someone to add them for you. What else can I do to help prevent falls? Wear shoes that: Do not have high heels. Have rubber bottoms. Are comfortable and fit you well. Are closed at the toe. Do not wear sandals. If you use a stepladder: Make sure that it is fully opened. Do not climb a closed stepladder. Make sure that both sides of the stepladder are locked into place. Ask someone to hold it for you, if possible. Clearly mark and make sure that you can see: Any grab bars or handrails. First and last steps. Where the edge of each step is. Use tools that help you move around (mobility aids)  if they are needed. These include: Canes. Walkers. Scooters. Crutches. Turn on the lights when you go into a dark area. Replace any light bulbs as soon as they burn out. Set up your furniture so you have a clear path. Avoid moving your furniture around. If any of your floors are uneven, fix them. If there are any pets around you, be aware of where they are. Review your medicines with your doctor. Some medicines can make you feel dizzy. This can increase your chance of falling. Ask your doctor what other things that you can do to help prevent falls. This information is not intended to replace advice given to you by your health care provider. Make sure you discuss any questions you have with your health care provider. Document Released: 09/01/2009 Document Revised: 04/12/2016 Document Reviewed: 12/10/2014 Elsevier Interactive Patient Education  2017 [employer].

## 2024-09-28 NOTE — Progress Notes (Signed)
 Subjective:   Jessica Mcdonald is a 88 y.o. female who presents for a Medicare Annual Wellness Visit.  Allergies (verified) Patient has no known allergies.   History: Past Medical History:  Diagnosis Date   Anorgasmia of female    Cancer [facility]) 1998   Left breast Lumpectomy, Chemo,Rad   Cataract 2010 2011   DDD (degenerative disc disease)    GERD (gastroesophageal reflux disease) 2000   Osteoporosis    Past Surgical History:  Procedure Laterality Date   ABDOMINAL HYSTERECTOMY  2019   with sling   BREAST SURGERY  3/98   lumpectomy (left) with nodes   BUNIONECTOMY Bilateral 2010   CATARACT EXTRACTION Bilateral 2010 2011   FLEXIBLE SIGMOIDOSCOPY N/A 12/29/2020   Procedure: FLEXIBLE SIGMOIDOSCOPY with propofol ;  Surgeon: Elicia Claw, MD;  Location: [facility];  Service: Gastroenterology;  Laterality: N/A;   FLEXIBLE SIGMOIDOSCOPY N/A 03/22/2021   Procedure: FLEXIBLE SIGMOIDOSCOPY;  Surgeon: Teresa Lonni HERO, MD;  Location: [facility];  Service: General;  Laterality: N/A;   MOHS SURGERY  08/2009   nose, squamous cell ca   SYNOVIAL CYST EXCISION     lumbar   XI ROBOTIC ASSISTED LOWER ANTERIOR RESECTION N/A 03/22/2021   Procedure: XI ROBOTIC ASSISTED LOWER ANTERIOR RESECTION TAKEDOWN OF COLOVAGINAL  FISTULA  AND REPAIR AND BILATERAL TAP BLOCKS;  Surgeon: Teresa Lonni HERO, MD;  Location: [facility];  Service: General;  Laterality: N/A;   Family History  Problem Relation Age of Onset   Osteoporosis Mother    Heart disease Father    Social History   Occupational History   Not on file  Tobacco Use   Smoking status: Former    Current packs/day: 0.00    Types: Cigarettes    Quit date: 07/18/1963    Years since quitting: 61.2   Smokeless tobacco: Never  Vaping Use   Vaping status: Never Used  Substance and Sexual Activity   Alcohol use: Yes    Alcohol/week: 10.0 standard drinks of alcohol    Types: 10 Standard drinks or equivalent per week   Drug use: No   Sexual  activity: Never    Partners: Male    Birth control/protection: Post-menopausal    Comment: husband vasectomy   Tobacco Counseling Counseling given: Not Answered  SDOH Screenings   Depression (PHQ2-9): Low Risk  (09/28/2024)  Tobacco Use: Medium Risk (09/28/2024)   Depression Screen    09/28/2024    4:11 PM 02/18/2024   10:36 AM  PHQ 2/9 Scores  PHQ - 2 Score 0 0     Goals Addressed   None    Visit info / Clinical Intake: Medicare Wellness Visit Type:: Initial Annual Wellness Visit Persons participating in visit:: patient Medicare Wellness Visit Mode:: In-person (required for WTM) Information given by:: patient Interpreter Needed?: No Pre-visit prep was completed: no AWV questionnaire completed by patient prior to visit?: no Living arrangements:: lives with spouse/significant other Patient's Overall Health Status Rating: excellent Typical amount of pain: some Does pain affect daily life?: no Are you currently prescribed opioids?: no  Dietary Habits and Nutritional Risks How many meals a day?: 3 Eats fruit and vegetables daily?: yes Most meals are obtained by: having others provide food In the last 2 weeks, have you had any of the following?: none Diabetic:: no  Functional Status Activities of Daily Living (to include ambulation/medication): Independent Ambulation: Independent Medication Administration: Independent Home Management: Independent Manage your own finances?: yes Primary transportation is: driving Concerns about vision?: no *vision screening  is required for WTM* Concerns about hearing?: no Uses hearing aids?: (!) yes Hear whispered voice?: yes  Fall Screening Falls in the past year?: 1 Number of falls in past year: 0 Was there an injury with Fall?: 1 Fall Risk Category Calculator: 2 Patient Fall Risk Level: Moderate Fall Risk  Fall Risk Patient at Risk for Falls Due to: Impaired balance/gait Fall risk Follow up: Falls evaluation  completed  Home and Transportation Safety: All rugs have non-skid backing?: yes (needs skid backing) All stairs or steps have railings?: (!) no Grab bars in the bathtub or shower?: yes Have non-skid surface in bathtub or shower?: (!) no (will look into it) Good home lighting?: yes Regular seat belt use?: yes Hospital stays in the last year:: no  Cognitive Assessment Difficulty concentrating, remembering, or making decisions? : no  Advance Directives (For Healthcare) Does Patient Have a Medical Advance Directive?: Yes Does patient want to make changes to medical advance directive?: No - Patient declined Type of Advance Directive: Living will Copy of Healthcare Power of Attorney in Chart?: Yes - validated most recent copy scanned in chart (See row information) Copy of Living Will in Chart?: Yes - validated most recent copy scanned in chart (See row information) Out of facility DNR (pink MOST or yellow form) in Chart? (Ambulatory ONLY): Yes - validated most recent copy scanned in chart Pre-existing out of facility DNR order (yellow form or pink MOST form): Yellow form placed in chart (order not valid for inpatient use)  Reviewed/Updated  Reviewed/Updated: Reviewed All (Medical, Surgical, Family, Medications, Allergies, Care Teams, Patient Goals)        Objective:    Today's Vitals   09/28/24 1554  BP: (!) 150/90  Pulse: 87  Temp: (!) 97.2 F (36.2 C)  SpO2: 96%  Weight: 133 lb 12.8 oz (60.7 kg)  Height: 5' 3 (1.6 m)   Body mass index is 23.7 kg/m. Wt Readings from Last 3 Encounters:  09/28/24 133 lb 12.8 oz (60.7 kg)  09/15/24 135 lb 12.8 oz (61.6 kg)  08/04/24 135 lb 6.4 oz (61.4 kg)     Current Medications (verified) Outpatient Encounter Medications as of 09/28/2024  Medication Sig   Azelaic Acid 15 % cream Apply 1 application topically 2 (two) times daily.   dicyclomine  (BENTYL ) 10 MG capsule Take 1 capsule (10 mg total) by mouth daily as needed for spasms.    doxycycline (VIBRA-TABS) 100 MG tablet Take 1 tablet (100 mg total) by mouth 2 (two) times daily.   estradiol (ESTRACE) 0.1 MG/GM vaginal cream Place 1 Applicatorful vaginally 2 (two) times a week.   fluticasone  (FLONASE ) 50 MCG/ACT nasal spray Place 1 spray into both nostrils at bedtime.   influenza vaccine adjuvanted (FLUAD  QUADRIVALENT) 0.5 ML injection Inject into the muscle.   ipratropium (ATROVENT ) 0.06 % nasal spray Place 1-2 sprays into both nostrils daily.   Magnesium 500 MG TABS Take 500 mg by mouth every other day.   mirabegron ER (MYRBETRIQ) 25 MG TB24 tablet Take 25 mg by mouth daily.   Omega-3 Fatty Acids (FISH OIL) 1000 MG CAPS Take 1,400 mg by mouth See admin instructions. Take 1000 mg 4 times weekly   pantoprazole  (PROTONIX ) 40 MG tablet Take 1 tablet (40 mg total) by mouth daily. Can take extr pill in the Evening PRN   saccharomyces boulardii (FLORASTOR) 250 MG capsule Take 250 mg by mouth 3 (three) times a week.   TURMERIC PO Take 1,000 mg by mouth daily.   vitamin  B-12 (CYANOCOBALAMIN) 1000 MCG tablet Take 1,000 mcg by mouth daily.   COVID-19 mRNA vaccine (SPIKEVAX) syringe Inject 0.5 mLs into the muscle as directed.   [DISCONTINUED] triamcinolone cream (KENALOG) 0.1 % Apply 1 Application topically 2 (two) times daily. (Patient not taking: Reported on 09/28/2024)   No facility-administered encounter medications on file as of 09/28/2024.   Hearing/Vision screen Hearing Screening - Comments:: Patient wears hearing aids Immunizations and Health Maintenance Health Maintenance  Topic Date Due   Zoster Vaccines- Shingrix (1 of 2) 12/16/1954   Mammogram  10/23/2024   COVID-19 Vaccine (7 - 2025-26 season) 11/18/2024 (Originally 07/20/2024)   DTaP/Tdap/Td (3 - Td or Tdap) 04/28/2025 (Originally 11/20/2011)   Medicare Annual Wellness (AWV)  09/28/2025   Pneumococcal Vaccine: 50+ Years  Completed   Influenza Vaccine  Completed   DEXA SCAN  Completed   Meningococcal B Vaccine  Aged  Out        Assessment/Plan:  This is a routine wellness examination for Teresa.  Patient Care Team: Charlanne Fredia CROME, MD as PCP - General (Internal Medicine) Mammography, [facility] (Diagnostic Radiology)  I have personally reviewed and noted the following in the patient's chart:   Medical and social history Use of alcohol, tobacco or illicit drugs  Current medications and supplements including opioid prescriptions. Functional ability and status Nutritional status Physical activity Advanced directives List of other physicians Hospitalizations, surgeries, and ER visits in previous 12 months Vitals Screenings to include cognitive, depression, and falls Referrals and appointments  No orders of the defined types were placed in this encounter.  In addition, I have reviewed and discussed with patient certain preventive protocols, quality metrics, and best practice recommendations. A written personalized care plan for preventive services as well as general preventive health recommendations were provided to patient.   Tawni America, NP   09/28/2024   Return in 1 year (on 09/28/2025).  After Visit Summary: (In Person-Printed) AVS printed and given to the patient  Nurse Notes: Recommend home bp monitoring if >150/90 let us  know

## 2024-10-26 ENCOUNTER — Telehealth: Payer: Self-pay | Admitting: Adult Health

## 2024-10-26 DIAGNOSIS — N3000 Acute cystitis without hematuria: Secondary | ICD-10-CM

## 2024-10-26 MED ORDER — CEPHALEXIN 500 MG PO CAPS: 500.0000 mg | ORAL_CAPSULE | Freq: Three times a day (TID) | ORAL | 0 refills | Status: AC

## 2024-10-26 NOTE — Telephone Encounter (Signed)
 UA C and S 100K of gram neg rods Sensitivity pending Script sent for Keflex .  Clinic nurse at [facility] to notify pt.

## 2024-11-03 ENCOUNTER — Other Ambulatory Visit: Payer: Self-pay | Admitting: Internal Medicine

## 2024-11-23 ENCOUNTER — Encounter: Payer: Self-pay | Admitting: Adult Health

## 2024-11-23 ENCOUNTER — Non-Acute Institutional Stay: Admitting: Adult Health

## 2024-11-23 VITALS — BP 154/90 | HR 93 | Temp 98.1°F | Ht 63.0 in | Wt 134.8 lb

## 2024-11-23 DIAGNOSIS — I10 Essential (primary) hypertension: Secondary | ICD-10-CM | POA: Diagnosis not present

## 2024-11-23 DIAGNOSIS — J069 Acute upper respiratory infection, unspecified: Secondary | ICD-10-CM | POA: Diagnosis not present

## 2024-11-23 MED ORDER — CEFDINIR 300 MG PO CAPS: 300.0000 mg | ORAL_CAPSULE | Freq: Two times a day (BID) | ORAL | 0 refills | Status: AC

## 2024-11-24 ENCOUNTER — Encounter: Payer: Self-pay | Admitting: Adult Health

## 2024-11-24 NOTE — Progress Notes (Signed)
 "  Location:  [facility]  POS: Clinic  Provider: Tawni America, ANP   Goals of Care:     09/28/2024    4:13 PM  Advanced Directives  Does Patient Have a Medical Advance Directive? Yes  Type of Advance Directive Living will     Chief Complaint  Patient presents with   Cough    HPI:  History of Present Illness Jessica Mcdonald is an 89 year old female who presents with symptoms of a cold and concerns about traveling.  Upper respiratory symptoms - Onset around December 30 with sore throat  - Progression to head cold with nasal congestion and initially dry, now productive cough - Cough is frequent when lying down at night - Nasal congestion treated with saline spray, which can trigger cough - Currently feels weak and skipped exercise today - No shortness of breath or significant wheezing - Flu and COVID-19 tests negative - Taking Mucinex expectorant  Travel concerns - Scheduled to travel to [place] on Wednesday for daughter's joint replacement surgery - Expresses concern about traveling while still experiencing cold symptoms     Past Medical History:  Diagnosis Date   Anorgasmia of female    Cancer [facility]) 1998   Left breast Lumpectomy, Chemo,Rad   Cataract 2010 2011   DDD (degenerative disc disease)    GERD (gastroesophageal reflux disease) 2000   Osteoporosis     Past Surgical History:  Procedure Laterality Date   ABDOMINAL HYSTERECTOMY  2019   with sling   BREAST SURGERY  3/98   lumpectomy (left) with nodes   BUNIONECTOMY Bilateral 2010   CATARACT EXTRACTION Bilateral 2010 2011   FLEXIBLE SIGMOIDOSCOPY N/A 12/29/2020   Procedure: FLEXIBLE SIGMOIDOSCOPY with propofol ;  Surgeon: Elicia Claw, MD;  Location: [facility];  Service: Gastroenterology;  Laterality: N/A;   FLEXIBLE SIGMOIDOSCOPY N/A 03/22/2021   Procedure: FLEXIBLE SIGMOIDOSCOPY;  Surgeon: Teresa Lonni HERO, MD;  Location: [facility];  Service: General;  Laterality: N/A;   MOHS SURGERY   08/2009   nose, squamous cell ca   SYNOVIAL CYST EXCISION     lumbar   XI ROBOTIC ASSISTED LOWER ANTERIOR RESECTION N/A 03/22/2021   Procedure: XI ROBOTIC ASSISTED LOWER ANTERIOR RESECTION TAKEDOWN OF COLOVAGINAL  FISTULA  AND REPAIR AND BILATERAL TAP BLOCKS;  Surgeon: Teresa Lonni HERO, MD;  Location: [facility];  Service: General;  Laterality: N/A;    Allergies[1]  Outpatient Encounter Medications as of 11/23/2024  Medication Sig   Azelaic Acid 15 % cream Apply 1 application topically 2 (two) times daily.   cefdinir  (OMNICEF ) 300 MG capsule Take 1 capsule (300 mg total) by mouth 2 (two) times daily.   COVID-19 mRNA vaccine (SPIKEVAX) syringe Inject 0.5 mLs into the muscle as directed.   dicyclomine  (BENTYL ) 10 MG capsule Take 1 capsule (10 mg total) by mouth daily as needed for spasms.   estradiol (ESTRACE) 0.1 MG/GM vaginal cream Place 1 Applicatorful vaginally 2 (two) times a week.   fluticasone  (FLONASE ) 50 MCG/ACT nasal spray Place 1 spray into both nostrils at bedtime.   influenza vaccine adjuvanted (FLUAD  QUADRIVALENT) 0.5 ML injection Inject into the muscle.   ipratropium (ATROVENT ) 0.06 % nasal spray USE 2 SPRAYS EACH NOSTRIL when NEEDED.   Magnesium 500 MG TABS Take 500 mg by mouth every other day.   mirabegron ER (MYRBETRIQ) 25 MG TB24 tablet Take 25 mg by mouth daily.   Omega-3 Fatty Acids (FISH OIL) 1000 MG CAPS Take 1,400 mg by mouth See admin instructions. Take  1000 mg 4 times weekly   pantoprazole  (PROTONIX ) 40 MG tablet Take 1 tablet (40 mg total) by mouth daily. Can take extr pill in the Evening PRN   saccharomyces boulardii (FLORASTOR) 250 MG capsule Take 250 mg by mouth 3 (three) times a week.   TURMERIC PO Take 1,000 mg by mouth daily.   vitamin B-12 (CYANOCOBALAMIN) 1000 MCG tablet Take 1,000 mcg by mouth daily.   [DISCONTINUED] doxycycline  (VIBRA -TABS) 100 MG tablet Take 1 tablet (100 mg total) by mouth 2 (two) times daily.   No facility-administered encounter  medications on file as of 11/23/2024.    Review of Systems:  Review of Systems  Constitutional:  Negative for activity change, appetite change, chills, diaphoresis, fatigue and fever.  HENT:  Positive for congestion, rhinorrhea, sinus pressure and sore throat. Negative for ear pain, facial swelling, postnasal drip and sinus pain.   Respiratory:  Positive for cough. Negative for shortness of breath and wheezing.   Cardiovascular:  Negative for chest pain and leg swelling.  Gastrointestinal:  Negative for abdominal distention, abdominal pain, constipation, diarrhea, nausea and vomiting.  Skin:  Negative for rash.  Neurological:  Positive for weakness. Negative for dizziness.  Psychiatric/Behavioral:  Negative for confusion.     Health Maintenance  Topic Date Due   Zoster Vaccines- Shingrix (1 of 2) 12/16/1954   COVID-19 Vaccine (7 - 2025-26 season) 07/20/2024   Mammogram  10/23/2024   DTaP/Tdap/Td (3 - Td or Tdap) 04/28/2025 (Originally 11/20/2011)   Medicare Annual Wellness (AWV)  09/28/2025   Pneumococcal Vaccine: 50+ Years  Completed   Influenza Vaccine  Completed   Bone Density Scan  Completed   Meningococcal B Vaccine  Aged Out    Physical Exam: Vitals:   11/23/24 1526 11/23/24 1550  BP: (!) 160/100 (!) 154/90  Pulse: 93   Temp: 98.1 F (36.7 C)   SpO2: 97%   Weight: 134 lb 12.8 oz (61.1 kg)   Height: 5' 3 (1.6 m)    Body mass index is 23.88 kg/m. Physical Exam Vitals and nursing note reviewed.  Constitutional:      General: She is not in acute distress.    Appearance: She is not diaphoretic.  HENT:     Head: Normocephalic and atraumatic.     Right Ear: Tympanic membrane and ear canal normal.     Left Ear: Tympanic membrane and ear canal normal.     Nose: Congestion and rhinorrhea present.     Mouth/Throat:     Mouth: Mucous membranes are moist.     Pharynx: Oropharynx is clear. Posterior oropharyngeal erythema present. No oropharyngeal exudate.  Eyes:      Conjunctiva/sclera: Conjunctivae normal.     Pupils: Pupils are equal, round, and reactive to light.  Neck:     Vascular: No JVD.  Cardiovascular:     Rate and Rhythm: Normal rate and regular rhythm.     Heart sounds: No murmur heard. Pulmonary:     Effort: Pulmonary effort is normal. No respiratory distress.     Breath sounds: Normal breath sounds. No wheezing.  Lymphadenopathy:     Cervical: Cervical adenopathy present.  Skin:    General: Skin is warm and dry.  Neurological:     Mental Status: She is alert and oriented to person, place, and time.     Labs reviewed: Basic Metabolic Panel: Recent Labs    03/26/24 0000 07/28/24 0000 09/15/24 1741  NA 140  140 137 140  K 4.4 4.6 4.4  CL 103 102 103  CO2 25* 23* 29*  BUN 13  13 12   --   CREATININE 0.7  0.7 0.8 0.6  CALCIUM 11.3*  11.3* 11.3* 10.6  TSH 2.32  --   --    Liver Function Tests: Recent Labs    03/26/24 0000 07/28/24 0000 09/15/24 1741  AST 27 20 21   ALT 24 17 18   ALKPHOS 62 70  --   ALBUMIN 4.3 4.0  --    No results for input(s): LIPASE, AMYLASE in the last 8760 hours. No results for input(s): AMMONIA in the last 8760 hours. CBC: Recent Labs    03/26/24 0000  WBC 4.1  HGB 14.2  HCT 43  PLT 298   Lipid Panel: Recent Labs    03/26/24 0000  CHOL 234*  HDL 127*  LDLCALC 95  TRIG 62   No results found for: HGBA1C  Procedures since last visit: No results found.  Assessment/Plan Assessment and Plan Assessment & Plan Acute upper respiratory infection Likely viral etiology.  No shortness of breath or wheezing. Negative for flu and COVID. Expected to improve in 1-2 days. - Advised mask use if coughing during travel. - Encouraged fluid intake. - Sent prescription for antibiotics if symptoms worsen or persist beyond Wednesday.  HTN Above goal at visit, which is a trend Hold myrbetriq (pt does not think it is helping) Check bp at home and bring log to f/u  apt      Labs/tests ordered:  * No order type specified * Next appt:  12/29/2024   Total time :  time greater than 50% of total time spent doing pt counseling and coordination of care         [1] No Known Allergies  "

## 2024-12-29 ENCOUNTER — Encounter: Payer: Self-pay | Admitting: Internal Medicine

## 2025-02-18 ENCOUNTER — Ambulatory Visit: Admitting: "Endocrinology
